# Patient Record
Sex: Female | Born: 1950 | Race: White | Hispanic: No | Smoking: Current every day smoker
Health system: Southern US, Community
[De-identification: ages and names within clinical notes are randomized; demographics above are authoritative.]

## PROBLEM LIST (undated history)

## (undated) DIAGNOSIS — I10 Essential (primary) hypertension: Secondary | ICD-10-CM

---

## 2000-02-12 ENCOUNTER — Encounter: Payer: Self-pay | Admitting: General Surgery

## 2000-02-12 ENCOUNTER — Ambulatory Visit (HOSPITAL_COMMUNITY): Admission: RE | Admit: 2000-02-12 | Discharge: 2000-02-12 | Payer: Self-pay | Admitting: General Surgery

## 2003-06-15 ENCOUNTER — Ambulatory Visit: Admission: RE | Admit: 2003-06-15 | Discharge: 2003-06-15 | Payer: Self-pay | Admitting: Gynecology

## 2003-06-20 ENCOUNTER — Ambulatory Visit (HOSPITAL_COMMUNITY): Admission: RE | Admit: 2003-06-20 | Discharge: 2003-06-20 | Payer: Self-pay | Admitting: Gynecology

## 2003-06-20 ENCOUNTER — Encounter: Payer: Self-pay | Admitting: Gynecology

## 2003-07-20 ENCOUNTER — Ambulatory Visit: Admission: RE | Admit: 2003-07-20 | Discharge: 2003-07-20 | Payer: Self-pay | Admitting: Gynecology

## 2003-07-22 ENCOUNTER — Ambulatory Visit: Admission: RE | Admit: 2003-07-22 | Discharge: 2003-09-16 | Payer: Self-pay | Admitting: Radiation Oncology

## 2003-07-22 ENCOUNTER — Emergency Department (HOSPITAL_COMMUNITY): Admission: EM | Admit: 2003-07-22 | Discharge: 2003-07-22 | Payer: Self-pay | Admitting: Emergency Medicine

## 2003-08-02 ENCOUNTER — Ambulatory Visit: Admission: RE | Admit: 2003-08-02 | Discharge: 2003-08-02 | Payer: Self-pay | Admitting: Gynecology

## 2003-08-03 ENCOUNTER — Ambulatory Visit (HOSPITAL_COMMUNITY): Admission: RE | Admit: 2003-08-03 | Discharge: 2003-08-03 | Payer: Self-pay | Admitting: Radiation Oncology

## 2003-10-11 ENCOUNTER — Other Ambulatory Visit: Admission: RE | Admit: 2003-10-11 | Discharge: 2003-10-11 | Payer: Self-pay | Admitting: Radiation Oncology

## 2003-11-30 ENCOUNTER — Ambulatory Visit: Admission: RE | Admit: 2003-11-30 | Discharge: 2003-11-30 | Payer: Self-pay | Admitting: Gynecology

## 2004-02-07 ENCOUNTER — Ambulatory Visit: Admission: RE | Admit: 2004-02-07 | Discharge: 2004-02-07 | Payer: Self-pay | Admitting: Gynecology

## 2004-02-28 ENCOUNTER — Ambulatory Visit: Admission: RE | Admit: 2004-02-28 | Discharge: 2004-02-28 | Payer: Self-pay | Admitting: Gynecology

## 2004-04-10 ENCOUNTER — Ambulatory Visit: Admission: RE | Admit: 2004-04-10 | Discharge: 2004-04-10 | Payer: Self-pay | Admitting: Radiation Oncology

## 2004-07-11 ENCOUNTER — Other Ambulatory Visit: Admission: RE | Admit: 2004-07-11 | Discharge: 2004-07-11 | Payer: Self-pay | Admitting: Gynecology

## 2004-07-11 ENCOUNTER — Ambulatory Visit: Admission: RE | Admit: 2004-07-11 | Discharge: 2004-07-11 | Payer: Self-pay | Admitting: Gynecology

## 2004-08-16 ENCOUNTER — Ambulatory Visit (HOSPITAL_COMMUNITY): Admission: RE | Admit: 2004-08-16 | Discharge: 2004-08-16 | Payer: Self-pay

## 2004-10-31 ENCOUNTER — Ambulatory Visit: Admission: RE | Admit: 2004-10-31 | Discharge: 2004-10-31 | Payer: Self-pay | Admitting: Gynecology

## 2004-10-31 ENCOUNTER — Other Ambulatory Visit: Admission: RE | Admit: 2004-10-31 | Discharge: 2004-10-31 | Payer: Self-pay | Admitting: Gynecology

## 2005-11-26 ENCOUNTER — Ambulatory Visit (HOSPITAL_COMMUNITY): Admission: RE | Admit: 2005-11-26 | Discharge: 2005-11-26 | Payer: Self-pay | Admitting: Internal Medicine

## 2011-01-20 ENCOUNTER — Encounter: Payer: Self-pay | Admitting: Gynecology

## 2011-01-20 ENCOUNTER — Encounter: Payer: Self-pay | Admitting: Internal Medicine

## 2015-01-31 DIAGNOSIS — M549 Dorsalgia, unspecified: Secondary | ICD-10-CM | POA: Diagnosis not present

## 2015-01-31 DIAGNOSIS — Z79899 Other long term (current) drug therapy: Secondary | ICD-10-CM | POA: Diagnosis not present

## 2015-01-31 DIAGNOSIS — R51 Headache: Secondary | ICD-10-CM | POA: Diagnosis not present

## 2015-01-31 DIAGNOSIS — H5711 Ocular pain, right eye: Secondary | ICD-10-CM | POA: Diagnosis not present

## 2015-01-31 DIAGNOSIS — F419 Anxiety disorder, unspecified: Secondary | ICD-10-CM | POA: Diagnosis not present

## 2015-01-31 DIAGNOSIS — E0590 Thyrotoxicosis, unspecified without thyrotoxic crisis or storm: Secondary | ICD-10-CM | POA: Diagnosis not present

## 2015-01-31 DIAGNOSIS — I10 Essential (primary) hypertension: Secondary | ICD-10-CM | POA: Diagnosis not present

## 2015-01-31 DIAGNOSIS — H5700 Unspecified anomaly of pupillary function: Secondary | ICD-10-CM | POA: Diagnosis not present

## 2015-05-03 DIAGNOSIS — E0590 Thyrotoxicosis, unspecified without thyrotoxic crisis or storm: Secondary | ICD-10-CM | POA: Diagnosis not present

## 2015-05-03 DIAGNOSIS — F419 Anxiety disorder, unspecified: Secondary | ICD-10-CM | POA: Diagnosis not present

## 2015-05-03 DIAGNOSIS — H5711 Ocular pain, right eye: Secondary | ICD-10-CM | POA: Diagnosis not present

## 2015-05-03 DIAGNOSIS — Z79899 Other long term (current) drug therapy: Secondary | ICD-10-CM | POA: Diagnosis not present

## 2015-05-03 DIAGNOSIS — G8929 Other chronic pain: Secondary | ICD-10-CM | POA: Diagnosis not present

## 2015-05-03 DIAGNOSIS — I10 Essential (primary) hypertension: Secondary | ICD-10-CM | POA: Diagnosis not present

## 2015-05-09 DIAGNOSIS — H40211 Acute angle-closure glaucoma, right eye: Secondary | ICD-10-CM | POA: Diagnosis not present

## 2015-05-09 DIAGNOSIS — H5711 Ocular pain, right eye: Secondary | ICD-10-CM | POA: Diagnosis not present

## 2015-05-22 DIAGNOSIS — H40211 Acute angle-closure glaucoma, right eye: Secondary | ICD-10-CM | POA: Diagnosis not present

## 2015-05-30 DIAGNOSIS — H40211 Acute angle-closure glaucoma, right eye: Secondary | ICD-10-CM | POA: Diagnosis not present

## 2015-06-13 DIAGNOSIS — H40211 Acute angle-closure glaucoma, right eye: Secondary | ICD-10-CM | POA: Diagnosis not present

## 2015-07-06 DIAGNOSIS — H40211 Acute angle-closure glaucoma, right eye: Secondary | ICD-10-CM | POA: Diagnosis not present

## 2015-08-03 DIAGNOSIS — H40212 Acute angle-closure glaucoma, left eye: Secondary | ICD-10-CM | POA: Diagnosis not present

## 2015-08-07 DIAGNOSIS — M549 Dorsalgia, unspecified: Secondary | ICD-10-CM | POA: Diagnosis not present

## 2015-08-07 DIAGNOSIS — E0590 Thyrotoxicosis, unspecified without thyrotoxic crisis or storm: Secondary | ICD-10-CM | POA: Diagnosis not present

## 2015-08-07 DIAGNOSIS — F419 Anxiety disorder, unspecified: Secondary | ICD-10-CM | POA: Diagnosis not present

## 2015-08-07 DIAGNOSIS — I10 Essential (primary) hypertension: Secondary | ICD-10-CM | POA: Diagnosis not present

## 2015-09-06 DIAGNOSIS — N390 Urinary tract infection, site not specified: Secondary | ICD-10-CM | POA: Diagnosis not present

## 2015-10-09 DIAGNOSIS — N390 Urinary tract infection, site not specified: Secondary | ICD-10-CM | POA: Diagnosis not present

## 2015-11-08 DIAGNOSIS — Z79899 Other long term (current) drug therapy: Secondary | ICD-10-CM | POA: Diagnosis not present

## 2015-11-08 DIAGNOSIS — E0590 Thyrotoxicosis, unspecified without thyrotoxic crisis or storm: Secondary | ICD-10-CM | POA: Diagnosis not present

## 2015-11-08 DIAGNOSIS — N390 Urinary tract infection, site not specified: Secondary | ICD-10-CM | POA: Diagnosis not present

## 2015-11-08 DIAGNOSIS — R5383 Other fatigue: Secondary | ICD-10-CM | POA: Diagnosis not present

## 2015-11-08 DIAGNOSIS — I10 Essential (primary) hypertension: Secondary | ICD-10-CM | POA: Diagnosis not present

## 2015-11-08 DIAGNOSIS — F419 Anxiety disorder, unspecified: Secondary | ICD-10-CM | POA: Diagnosis not present

## 2015-11-08 DIAGNOSIS — M549 Dorsalgia, unspecified: Secondary | ICD-10-CM | POA: Diagnosis not present

## 2015-11-14 DIAGNOSIS — R140 Abdominal distension (gaseous): Secondary | ICD-10-CM | POA: Diagnosis not present

## 2015-11-14 DIAGNOSIS — R1032 Left lower quadrant pain: Secondary | ICD-10-CM | POA: Diagnosis not present

## 2015-11-15 DIAGNOSIS — R140 Abdominal distension (gaseous): Secondary | ICD-10-CM | POA: Diagnosis not present

## 2015-11-15 DIAGNOSIS — R1909 Other intra-abdominal and pelvic swelling, mass and lump: Secondary | ICD-10-CM | POA: Diagnosis not present

## 2015-11-15 DIAGNOSIS — R1032 Left lower quadrant pain: Secondary | ICD-10-CM | POA: Diagnosis not present

## 2015-11-21 DIAGNOSIS — R109 Unspecified abdominal pain: Secondary | ICD-10-CM | POA: Diagnosis not present

## 2015-11-29 DIAGNOSIS — N2889 Other specified disorders of kidney and ureter: Secondary | ICD-10-CM | POA: Diagnosis not present

## 2015-11-29 DIAGNOSIS — N281 Cyst of kidney, acquired: Secondary | ICD-10-CM | POA: Diagnosis not present

## 2015-11-29 DIAGNOSIS — N289 Disorder of kidney and ureter, unspecified: Secondary | ICD-10-CM | POA: Diagnosis not present

## 2015-12-28 DIAGNOSIS — R102 Pelvic and perineal pain: Secondary | ICD-10-CM | POA: Diagnosis not present

## 2016-01-02 DIAGNOSIS — R102 Pelvic and perineal pain: Secondary | ICD-10-CM | POA: Diagnosis not present

## 2016-02-08 DIAGNOSIS — E0590 Thyrotoxicosis, unspecified without thyrotoxic crisis or storm: Secondary | ICD-10-CM | POA: Diagnosis not present

## 2016-02-08 DIAGNOSIS — M549 Dorsalgia, unspecified: Secondary | ICD-10-CM | POA: Diagnosis not present

## 2016-02-08 DIAGNOSIS — F419 Anxiety disorder, unspecified: Secondary | ICD-10-CM | POA: Diagnosis not present

## 2016-02-08 DIAGNOSIS — I10 Essential (primary) hypertension: Secondary | ICD-10-CM | POA: Diagnosis not present

## 2016-04-25 DIAGNOSIS — R634 Abnormal weight loss: Secondary | ICD-10-CM | POA: Diagnosis not present

## 2016-04-25 DIAGNOSIS — N390 Urinary tract infection, site not specified: Secondary | ICD-10-CM | POA: Diagnosis not present

## 2016-05-10 DIAGNOSIS — H40211 Acute angle-closure glaucoma, right eye: Secondary | ICD-10-CM | POA: Diagnosis not present

## 2016-05-13 DIAGNOSIS — H40211 Acute angle-closure glaucoma, right eye: Secondary | ICD-10-CM | POA: Diagnosis not present

## 2016-05-14 DIAGNOSIS — E0590 Thyrotoxicosis, unspecified without thyrotoxic crisis or storm: Secondary | ICD-10-CM | POA: Diagnosis not present

## 2016-05-14 DIAGNOSIS — D692 Other nonthrombocytopenic purpura: Secondary | ICD-10-CM | POA: Diagnosis not present

## 2016-05-14 DIAGNOSIS — F419 Anxiety disorder, unspecified: Secondary | ICD-10-CM | POA: Diagnosis not present

## 2016-05-14 DIAGNOSIS — R634 Abnormal weight loss: Secondary | ICD-10-CM | POA: Diagnosis not present

## 2016-05-14 DIAGNOSIS — Z79899 Other long term (current) drug therapy: Secondary | ICD-10-CM | POA: Diagnosis not present

## 2016-05-14 DIAGNOSIS — I10 Essential (primary) hypertension: Secondary | ICD-10-CM | POA: Diagnosis not present

## 2016-05-14 DIAGNOSIS — R51 Headache: Secondary | ICD-10-CM | POA: Diagnosis not present

## 2016-05-21 DIAGNOSIS — H40211 Acute angle-closure glaucoma, right eye: Secondary | ICD-10-CM | POA: Diagnosis not present

## 2016-06-04 DIAGNOSIS — H40211 Acute angle-closure glaucoma, right eye: Secondary | ICD-10-CM | POA: Diagnosis not present

## 2016-06-11 DIAGNOSIS — I10 Essential (primary) hypertension: Secondary | ICD-10-CM | POA: Diagnosis not present

## 2016-06-11 DIAGNOSIS — F419 Anxiety disorder, unspecified: Secondary | ICD-10-CM | POA: Diagnosis not present

## 2016-06-11 DIAGNOSIS — E0590 Thyrotoxicosis, unspecified without thyrotoxic crisis or storm: Secondary | ICD-10-CM | POA: Diagnosis not present

## 2016-06-25 DIAGNOSIS — H40032 Anatomical narrow angle, left eye: Secondary | ICD-10-CM | POA: Diagnosis not present

## 2016-06-25 DIAGNOSIS — H40211 Acute angle-closure glaucoma, right eye: Secondary | ICD-10-CM | POA: Diagnosis not present

## 2016-07-09 DIAGNOSIS — H40032 Anatomical narrow angle, left eye: Secondary | ICD-10-CM | POA: Diagnosis not present

## 2016-07-16 DIAGNOSIS — E0590 Thyrotoxicosis, unspecified without thyrotoxic crisis or storm: Secondary | ICD-10-CM | POA: Diagnosis not present

## 2016-07-23 DIAGNOSIS — H40211 Acute angle-closure glaucoma, right eye: Secondary | ICD-10-CM | POA: Diagnosis not present

## 2016-08-13 DIAGNOSIS — F419 Anxiety disorder, unspecified: Secondary | ICD-10-CM | POA: Diagnosis not present

## 2016-08-13 DIAGNOSIS — E0590 Thyrotoxicosis, unspecified without thyrotoxic crisis or storm: Secondary | ICD-10-CM | POA: Diagnosis not present

## 2016-08-13 DIAGNOSIS — I10 Essential (primary) hypertension: Secondary | ICD-10-CM | POA: Diagnosis not present

## 2016-08-26 DIAGNOSIS — H40211 Acute angle-closure glaucoma, right eye: Secondary | ICD-10-CM | POA: Diagnosis not present

## 2016-10-07 DIAGNOSIS — Z1231 Encounter for screening mammogram for malignant neoplasm of breast: Secondary | ICD-10-CM | POA: Diagnosis not present

## 2016-10-23 DIAGNOSIS — N6489 Other specified disorders of breast: Secondary | ICD-10-CM | POA: Diagnosis not present

## 2016-10-23 DIAGNOSIS — R928 Other abnormal and inconclusive findings on diagnostic imaging of breast: Secondary | ICD-10-CM | POA: Diagnosis not present

## 2016-11-11 DIAGNOSIS — Z79899 Other long term (current) drug therapy: Secondary | ICD-10-CM | POA: Diagnosis not present

## 2016-11-11 DIAGNOSIS — E0590 Thyrotoxicosis, unspecified without thyrotoxic crisis or storm: Secondary | ICD-10-CM | POA: Diagnosis not present

## 2016-11-11 DIAGNOSIS — I10 Essential (primary) hypertension: Secondary | ICD-10-CM | POA: Diagnosis not present

## 2016-11-11 DIAGNOSIS — F419 Anxiety disorder, unspecified: Secondary | ICD-10-CM | POA: Diagnosis not present

## 2017-03-17 DIAGNOSIS — Z79899 Other long term (current) drug therapy: Secondary | ICD-10-CM | POA: Diagnosis not present

## 2017-03-17 DIAGNOSIS — M549 Dorsalgia, unspecified: Secondary | ICD-10-CM | POA: Diagnosis not present

## 2017-03-17 DIAGNOSIS — F419 Anxiety disorder, unspecified: Secondary | ICD-10-CM | POA: Diagnosis not present

## 2017-03-17 DIAGNOSIS — E0590 Thyrotoxicosis, unspecified without thyrotoxic crisis or storm: Secondary | ICD-10-CM | POA: Diagnosis not present

## 2017-03-17 DIAGNOSIS — I10 Essential (primary) hypertension: Secondary | ICD-10-CM | POA: Diagnosis not present

## 2017-05-09 DIAGNOSIS — I10 Essential (primary) hypertension: Secondary | ICD-10-CM | POA: Diagnosis not present

## 2017-05-09 DIAGNOSIS — R300 Dysuria: Secondary | ICD-10-CM | POA: Diagnosis not present

## 2017-05-14 DIAGNOSIS — H2513 Age-related nuclear cataract, bilateral: Secondary | ICD-10-CM | POA: Diagnosis not present

## 2017-05-14 DIAGNOSIS — H21541 Posterior synechiae (iris), right eye: Secondary | ICD-10-CM | POA: Diagnosis not present

## 2017-06-16 DIAGNOSIS — M549 Dorsalgia, unspecified: Secondary | ICD-10-CM | POA: Diagnosis not present

## 2017-06-16 DIAGNOSIS — M62838 Other muscle spasm: Secondary | ICD-10-CM | POA: Diagnosis not present

## 2017-07-01 DIAGNOSIS — L720 Epidermal cyst: Secondary | ICD-10-CM | POA: Diagnosis not present

## 2017-07-17 DIAGNOSIS — L720 Epidermal cyst: Secondary | ICD-10-CM | POA: Diagnosis not present

## 2017-07-25 DIAGNOSIS — F419 Anxiety disorder, unspecified: Secondary | ICD-10-CM | POA: Diagnosis not present

## 2017-09-16 DIAGNOSIS — R1030 Lower abdominal pain, unspecified: Secondary | ICD-10-CM | POA: Diagnosis not present

## 2017-09-16 DIAGNOSIS — F419 Anxiety disorder, unspecified: Secondary | ICD-10-CM | POA: Diagnosis not present

## 2017-09-16 DIAGNOSIS — N390 Urinary tract infection, site not specified: Secondary | ICD-10-CM | POA: Diagnosis not present

## 2017-09-16 DIAGNOSIS — Z79899 Other long term (current) drug therapy: Secondary | ICD-10-CM | POA: Diagnosis not present

## 2017-09-16 DIAGNOSIS — E0590 Thyrotoxicosis, unspecified without thyrotoxic crisis or storm: Secondary | ICD-10-CM | POA: Diagnosis not present

## 2017-09-16 DIAGNOSIS — I10 Essential (primary) hypertension: Secondary | ICD-10-CM | POA: Diagnosis not present

## 2017-09-16 DIAGNOSIS — N281 Cyst of kidney, acquired: Secondary | ICD-10-CM | POA: Diagnosis not present

## 2017-09-16 DIAGNOSIS — R109 Unspecified abdominal pain: Secondary | ICD-10-CM | POA: Diagnosis not present

## 2017-09-16 DIAGNOSIS — R197 Diarrhea, unspecified: Secondary | ICD-10-CM | POA: Diagnosis not present

## 2017-09-16 DIAGNOSIS — M549 Dorsalgia, unspecified: Secondary | ICD-10-CM | POA: Diagnosis not present

## 2017-10-07 DIAGNOSIS — E876 Hypokalemia: Secondary | ICD-10-CM | POA: Diagnosis not present

## 2018-01-27 DIAGNOSIS — F5101 Primary insomnia: Secondary | ICD-10-CM | POA: Diagnosis not present

## 2018-01-27 DIAGNOSIS — I10 Essential (primary) hypertension: Secondary | ICD-10-CM | POA: Diagnosis not present

## 2018-01-27 DIAGNOSIS — F419 Anxiety disorder, unspecified: Secondary | ICD-10-CM | POA: Diagnosis not present

## 2018-01-27 DIAGNOSIS — E0590 Thyrotoxicosis, unspecified without thyrotoxic crisis or storm: Secondary | ICD-10-CM | POA: Diagnosis not present

## 2018-01-27 DIAGNOSIS — Z79899 Other long term (current) drug therapy: Secondary | ICD-10-CM | POA: Diagnosis not present

## 2018-01-27 DIAGNOSIS — G894 Chronic pain syndrome: Secondary | ICD-10-CM | POA: Diagnosis not present

## 2018-05-13 DIAGNOSIS — Z79899 Other long term (current) drug therapy: Secondary | ICD-10-CM | POA: Diagnosis not present

## 2018-05-13 DIAGNOSIS — F419 Anxiety disorder, unspecified: Secondary | ICD-10-CM | POA: Diagnosis not present

## 2018-05-13 DIAGNOSIS — E0590 Thyrotoxicosis, unspecified without thyrotoxic crisis or storm: Secondary | ICD-10-CM | POA: Diagnosis not present

## 2018-05-18 DIAGNOSIS — I10 Essential (primary) hypertension: Secondary | ICD-10-CM | POA: Diagnosis not present

## 2018-05-18 DIAGNOSIS — E0590 Thyrotoxicosis, unspecified without thyrotoxic crisis or storm: Secondary | ICD-10-CM | POA: Diagnosis not present

## 2018-05-18 DIAGNOSIS — L989 Disorder of the skin and subcutaneous tissue, unspecified: Secondary | ICD-10-CM | POA: Diagnosis not present

## 2018-05-18 DIAGNOSIS — R630 Anorexia: Secondary | ICD-10-CM | POA: Diagnosis not present

## 2018-05-18 DIAGNOSIS — R21 Rash and other nonspecific skin eruption: Secondary | ICD-10-CM | POA: Diagnosis not present

## 2018-06-15 DIAGNOSIS — E0590 Thyrotoxicosis, unspecified without thyrotoxic crisis or storm: Secondary | ICD-10-CM | POA: Diagnosis not present

## 2018-06-15 DIAGNOSIS — R002 Palpitations: Secondary | ICD-10-CM | POA: Diagnosis not present

## 2018-06-15 DIAGNOSIS — F419 Anxiety disorder, unspecified: Secondary | ICD-10-CM | POA: Diagnosis not present

## 2018-06-15 DIAGNOSIS — I10 Essential (primary) hypertension: Secondary | ICD-10-CM | POA: Diagnosis not present

## 2018-07-27 DIAGNOSIS — R5383 Other fatigue: Secondary | ICD-10-CM | POA: Diagnosis not present

## 2018-07-27 DIAGNOSIS — R634 Abnormal weight loss: Secondary | ICD-10-CM | POA: Diagnosis not present

## 2018-07-27 DIAGNOSIS — I10 Essential (primary) hypertension: Secondary | ICD-10-CM | POA: Diagnosis not present

## 2018-07-27 DIAGNOSIS — R630 Anorexia: Secondary | ICD-10-CM | POA: Diagnosis not present

## 2018-07-27 DIAGNOSIS — E0590 Thyrotoxicosis, unspecified without thyrotoxic crisis or storm: Secondary | ICD-10-CM | POA: Diagnosis not present

## 2018-07-27 DIAGNOSIS — Z79899 Other long term (current) drug therapy: Secondary | ICD-10-CM | POA: Diagnosis not present

## 2018-08-03 DIAGNOSIS — N6320 Unspecified lump in the left breast, unspecified quadrant: Secondary | ICD-10-CM | POA: Diagnosis not present

## 2018-08-03 DIAGNOSIS — D239 Other benign neoplasm of skin, unspecified: Secondary | ICD-10-CM | POA: Diagnosis not present

## 2018-08-03 DIAGNOSIS — Z124 Encounter for screening for malignant neoplasm of cervix: Secondary | ICD-10-CM | POA: Diagnosis not present

## 2018-08-03 DIAGNOSIS — Z1159 Encounter for screening for other viral diseases: Secondary | ICD-10-CM | POA: Diagnosis not present

## 2018-08-03 DIAGNOSIS — Z1331 Encounter for screening for depression: Secondary | ICD-10-CM | POA: Diagnosis not present

## 2018-08-03 DIAGNOSIS — M310 Hypersensitivity angiitis: Secondary | ICD-10-CM | POA: Diagnosis not present

## 2018-08-03 DIAGNOSIS — K6289 Other specified diseases of anus and rectum: Secondary | ICD-10-CM | POA: Diagnosis not present

## 2018-08-03 DIAGNOSIS — Z0001 Encounter for general adult medical examination with abnormal findings: Secondary | ICD-10-CM | POA: Diagnosis not present

## 2018-08-03 DIAGNOSIS — Z1389 Encounter for screening for other disorder: Secondary | ICD-10-CM | POA: Diagnosis not present

## 2018-08-03 DIAGNOSIS — R634 Abnormal weight loss: Secondary | ICD-10-CM | POA: Diagnosis not present

## 2018-08-06 DIAGNOSIS — I10 Essential (primary) hypertension: Secondary | ICD-10-CM | POA: Diagnosis not present

## 2018-08-06 DIAGNOSIS — M310 Hypersensitivity angiitis: Secondary | ICD-10-CM | POA: Diagnosis not present

## 2018-08-06 DIAGNOSIS — R768 Other specified abnormal immunological findings in serum: Secondary | ICD-10-CM | POA: Diagnosis not present

## 2018-08-06 DIAGNOSIS — R634 Abnormal weight loss: Secondary | ICD-10-CM | POA: Diagnosis not present

## 2018-08-06 DIAGNOSIS — B1920 Unspecified viral hepatitis C without hepatic coma: Secondary | ICD-10-CM | POA: Diagnosis not present

## 2018-08-18 DIAGNOSIS — B1920 Unspecified viral hepatitis C without hepatic coma: Secondary | ICD-10-CM | POA: Diagnosis not present

## 2018-08-18 DIAGNOSIS — I10 Essential (primary) hypertension: Secondary | ICD-10-CM | POA: Diagnosis not present

## 2018-08-27 DIAGNOSIS — K629 Disease of anus and rectum, unspecified: Secondary | ICD-10-CM | POA: Diagnosis not present

## 2018-08-27 DIAGNOSIS — N9489 Other specified conditions associated with female genital organs and menstrual cycle: Secondary | ICD-10-CM | POA: Diagnosis not present

## 2018-08-27 DIAGNOSIS — Z1211 Encounter for screening for malignant neoplasm of colon: Secondary | ICD-10-CM | POA: Diagnosis not present

## 2018-08-27 DIAGNOSIS — B182 Chronic viral hepatitis C: Secondary | ICD-10-CM | POA: Diagnosis not present

## 2018-08-27 DIAGNOSIS — R1032 Left lower quadrant pain: Secondary | ICD-10-CM | POA: Diagnosis not present

## 2018-09-01 DIAGNOSIS — N281 Cyst of kidney, acquired: Secondary | ICD-10-CM | POA: Diagnosis not present

## 2018-09-01 DIAGNOSIS — R5383 Other fatigue: Secondary | ICD-10-CM | POA: Diagnosis not present

## 2018-09-01 DIAGNOSIS — I700 Atherosclerosis of aorta: Secondary | ICD-10-CM | POA: Diagnosis not present

## 2018-09-01 DIAGNOSIS — B1920 Unspecified viral hepatitis C without hepatic coma: Secondary | ICD-10-CM | POA: Diagnosis not present

## 2018-09-01 DIAGNOSIS — Z9049 Acquired absence of other specified parts of digestive tract: Secondary | ICD-10-CM | POA: Diagnosis not present

## 2018-09-01 DIAGNOSIS — F419 Anxiety disorder, unspecified: Secondary | ICD-10-CM | POA: Diagnosis not present

## 2018-10-27 DIAGNOSIS — B1920 Unspecified viral hepatitis C without hepatic coma: Secondary | ICD-10-CM | POA: Diagnosis not present

## 2018-10-27 DIAGNOSIS — I10 Essential (primary) hypertension: Secondary | ICD-10-CM | POA: Diagnosis not present

## 2018-10-27 DIAGNOSIS — F419 Anxiety disorder, unspecified: Secondary | ICD-10-CM | POA: Diagnosis not present

## 2018-12-28 DIAGNOSIS — I10 Essential (primary) hypertension: Secondary | ICD-10-CM | POA: Diagnosis not present

## 2018-12-28 DIAGNOSIS — B1920 Unspecified viral hepatitis C without hepatic coma: Secondary | ICD-10-CM | POA: Diagnosis not present

## 2018-12-28 DIAGNOSIS — Z79899 Other long term (current) drug therapy: Secondary | ICD-10-CM | POA: Diagnosis not present

## 2018-12-28 DIAGNOSIS — F419 Anxiety disorder, unspecified: Secondary | ICD-10-CM | POA: Diagnosis not present

## 2018-12-28 DIAGNOSIS — E0590 Thyrotoxicosis, unspecified without thyrotoxic crisis or storm: Secondary | ICD-10-CM | POA: Diagnosis not present

## 2019-03-30 DIAGNOSIS — E0590 Thyrotoxicosis, unspecified without thyrotoxic crisis or storm: Secondary | ICD-10-CM | POA: Diagnosis not present

## 2019-03-30 DIAGNOSIS — F419 Anxiety disorder, unspecified: Secondary | ICD-10-CM | POA: Diagnosis not present

## 2019-03-30 DIAGNOSIS — J302 Other seasonal allergic rhinitis: Secondary | ICD-10-CM | POA: Diagnosis not present

## 2019-03-30 DIAGNOSIS — I10 Essential (primary) hypertension: Secondary | ICD-10-CM | POA: Diagnosis not present

## 2019-07-27 DIAGNOSIS — B1920 Unspecified viral hepatitis C without hepatic coma: Secondary | ICD-10-CM | POA: Diagnosis not present

## 2019-07-27 DIAGNOSIS — I10 Essential (primary) hypertension: Secondary | ICD-10-CM | POA: Diagnosis not present

## 2019-07-27 DIAGNOSIS — Z79899 Other long term (current) drug therapy: Secondary | ICD-10-CM | POA: Diagnosis not present

## 2019-07-27 DIAGNOSIS — E0590 Thyrotoxicosis, unspecified without thyrotoxic crisis or storm: Secondary | ICD-10-CM | POA: Diagnosis not present

## 2019-07-27 DIAGNOSIS — F419 Anxiety disorder, unspecified: Secondary | ICD-10-CM | POA: Diagnosis not present

## 2019-11-03 DIAGNOSIS — Z6820 Body mass index (BMI) 20.0-20.9, adult: Secondary | ICD-10-CM | POA: Diagnosis not present

## 2019-11-03 DIAGNOSIS — M25512 Pain in left shoulder: Secondary | ICD-10-CM | POA: Diagnosis not present

## 2019-11-03 DIAGNOSIS — M19012 Primary osteoarthritis, left shoulder: Secondary | ICD-10-CM | POA: Diagnosis not present

## 2019-11-03 DIAGNOSIS — M5032 Other cervical disc degeneration, mid-cervical region, unspecified level: Secondary | ICD-10-CM | POA: Diagnosis not present

## 2019-11-03 DIAGNOSIS — M542 Cervicalgia: Secondary | ICD-10-CM | POA: Diagnosis not present

## 2019-12-27 DIAGNOSIS — E0590 Thyrotoxicosis, unspecified without thyrotoxic crisis or storm: Secondary | ICD-10-CM | POA: Diagnosis not present

## 2019-12-27 DIAGNOSIS — Z6821 Body mass index (BMI) 21.0-21.9, adult: Secondary | ICD-10-CM | POA: Diagnosis not present

## 2019-12-27 DIAGNOSIS — I10 Essential (primary) hypertension: Secondary | ICD-10-CM | POA: Diagnosis not present

## 2019-12-27 DIAGNOSIS — Z79899 Other long term (current) drug therapy: Secondary | ICD-10-CM | POA: Diagnosis not present

## 2019-12-27 DIAGNOSIS — F419 Anxiety disorder, unspecified: Secondary | ICD-10-CM | POA: Diagnosis not present

## 2019-12-27 DIAGNOSIS — Z1331 Encounter for screening for depression: Secondary | ICD-10-CM | POA: Diagnosis not present

## 2019-12-27 DIAGNOSIS — Z23 Encounter for immunization: Secondary | ICD-10-CM | POA: Diagnosis not present

## 2019-12-27 DIAGNOSIS — Z1339 Encounter for screening examination for other mental health and behavioral disorders: Secondary | ICD-10-CM | POA: Diagnosis not present

## 2019-12-27 DIAGNOSIS — Z0000 Encounter for general adult medical examination without abnormal findings: Secondary | ICD-10-CM | POA: Diagnosis not present

## 2020-04-05 DIAGNOSIS — M25561 Pain in right knee: Secondary | ICD-10-CM | POA: Diagnosis not present

## 2020-04-05 DIAGNOSIS — M25551 Pain in right hip: Secondary | ICD-10-CM | POA: Diagnosis not present

## 2020-04-05 DIAGNOSIS — E0590 Thyrotoxicosis, unspecified without thyrotoxic crisis or storm: Secondary | ICD-10-CM | POA: Diagnosis not present

## 2020-04-05 DIAGNOSIS — Z1331 Encounter for screening for depression: Secondary | ICD-10-CM | POA: Diagnosis not present

## 2020-04-05 DIAGNOSIS — Z6822 Body mass index (BMI) 22.0-22.9, adult: Secondary | ICD-10-CM | POA: Diagnosis not present

## 2020-04-05 DIAGNOSIS — Z79899 Other long term (current) drug therapy: Secondary | ICD-10-CM | POA: Diagnosis not present

## 2020-04-06 DIAGNOSIS — M25561 Pain in right knee: Secondary | ICD-10-CM | POA: Diagnosis not present

## 2020-04-06 DIAGNOSIS — M25551 Pain in right hip: Secondary | ICD-10-CM | POA: Diagnosis not present

## 2020-04-06 DIAGNOSIS — M1611 Unilateral primary osteoarthritis, right hip: Secondary | ICD-10-CM | POA: Diagnosis not present

## 2020-04-06 DIAGNOSIS — M1711 Unilateral primary osteoarthritis, right knee: Secondary | ICD-10-CM | POA: Diagnosis not present

## 2020-06-27 DIAGNOSIS — Z1331 Encounter for screening for depression: Secondary | ICD-10-CM | POA: Diagnosis not present

## 2020-06-27 DIAGNOSIS — R1909 Other intra-abdominal and pelvic swelling, mass and lump: Secondary | ICD-10-CM | POA: Diagnosis not present

## 2020-06-27 DIAGNOSIS — M25561 Pain in right knee: Secondary | ICD-10-CM | POA: Diagnosis not present

## 2020-07-14 DIAGNOSIS — R1909 Other intra-abdominal and pelvic swelling, mass and lump: Secondary | ICD-10-CM | POA: Diagnosis not present

## 2020-07-14 DIAGNOSIS — M25561 Pain in right knee: Secondary | ICD-10-CM | POA: Diagnosis not present

## 2020-08-03 DIAGNOSIS — M1711 Unilateral primary osteoarthritis, right knee: Secondary | ICD-10-CM | POA: Diagnosis not present

## 2020-08-28 DIAGNOSIS — Z79899 Other long term (current) drug therapy: Secondary | ICD-10-CM | POA: Diagnosis not present

## 2020-08-28 DIAGNOSIS — Z01818 Encounter for other preprocedural examination: Secondary | ICD-10-CM | POA: Diagnosis not present

## 2020-08-28 DIAGNOSIS — E559 Vitamin D deficiency, unspecified: Secondary | ICD-10-CM | POA: Diagnosis not present

## 2020-08-28 DIAGNOSIS — M79609 Pain in unspecified limb: Secondary | ICD-10-CM | POA: Diagnosis not present

## 2020-08-28 DIAGNOSIS — R52 Pain, unspecified: Secondary | ICD-10-CM | POA: Diagnosis not present

## 2020-09-13 DIAGNOSIS — F419 Anxiety disorder, unspecified: Secondary | ICD-10-CM | POA: Diagnosis not present

## 2020-09-13 DIAGNOSIS — I10 Essential (primary) hypertension: Secondary | ICD-10-CM | POA: Diagnosis not present

## 2020-10-24 DIAGNOSIS — Z1159 Encounter for screening for other viral diseases: Secondary | ICD-10-CM | POA: Diagnosis not present

## 2020-10-24 DIAGNOSIS — Z1152 Encounter for screening for COVID-19: Secondary | ICD-10-CM | POA: Diagnosis not present

## 2020-10-25 DIAGNOSIS — M79609 Pain in unspecified limb: Secondary | ICD-10-CM | POA: Diagnosis not present

## 2020-10-25 DIAGNOSIS — Z79899 Other long term (current) drug therapy: Secondary | ICD-10-CM | POA: Diagnosis not present

## 2020-10-25 DIAGNOSIS — Z01818 Encounter for other preprocedural examination: Secondary | ICD-10-CM | POA: Diagnosis not present

## 2020-10-25 DIAGNOSIS — R52 Pain, unspecified: Secondary | ICD-10-CM | POA: Diagnosis not present

## 2020-10-30 DIAGNOSIS — Z9889 Other specified postprocedural states: Secondary | ICD-10-CM | POA: Diagnosis not present

## 2020-10-30 DIAGNOSIS — Z96651 Presence of right artificial knee joint: Secondary | ICD-10-CM | POA: Diagnosis not present

## 2020-10-30 DIAGNOSIS — M1711 Unilateral primary osteoarthritis, right knee: Secondary | ICD-10-CM | POA: Diagnosis not present

## 2020-10-30 DIAGNOSIS — Z8614 Personal history of Methicillin resistant Staphylococcus aureus infection: Secondary | ICD-10-CM | POA: Diagnosis not present

## 2020-10-30 DIAGNOSIS — B182 Chronic viral hepatitis C: Secondary | ICD-10-CM | POA: Diagnosis not present

## 2020-10-30 DIAGNOSIS — F17200 Nicotine dependence, unspecified, uncomplicated: Secondary | ICD-10-CM | POA: Diagnosis not present

## 2020-10-30 DIAGNOSIS — Z79899 Other long term (current) drug therapy: Secondary | ICD-10-CM | POA: Diagnosis not present

## 2020-10-30 DIAGNOSIS — I10 Essential (primary) hypertension: Secondary | ICD-10-CM | POA: Diagnosis not present

## 2020-10-30 DIAGNOSIS — F17210 Nicotine dependence, cigarettes, uncomplicated: Secondary | ICD-10-CM | POA: Diagnosis not present

## 2020-10-30 DIAGNOSIS — Z471 Aftercare following joint replacement surgery: Secondary | ICD-10-CM | POA: Diagnosis not present

## 2020-10-30 DIAGNOSIS — G8918 Other acute postprocedural pain: Secondary | ICD-10-CM | POA: Diagnosis not present

## 2020-10-30 DIAGNOSIS — E0500 Thyrotoxicosis with diffuse goiter without thyrotoxic crisis or storm: Secondary | ICD-10-CM | POA: Diagnosis not present

## 2020-10-31 DIAGNOSIS — I10 Essential (primary) hypertension: Secondary | ICD-10-CM | POA: Diagnosis not present

## 2020-10-31 DIAGNOSIS — B182 Chronic viral hepatitis C: Secondary | ICD-10-CM | POA: Diagnosis not present

## 2020-10-31 DIAGNOSIS — M1711 Unilateral primary osteoarthritis, right knee: Secondary | ICD-10-CM | POA: Diagnosis not present

## 2020-10-31 DIAGNOSIS — E0500 Thyrotoxicosis with diffuse goiter without thyrotoxic crisis or storm: Secondary | ICD-10-CM | POA: Diagnosis not present

## 2020-10-31 DIAGNOSIS — F17200 Nicotine dependence, unspecified, uncomplicated: Secondary | ICD-10-CM | POA: Diagnosis not present

## 2020-10-31 DIAGNOSIS — Z8614 Personal history of Methicillin resistant Staphylococcus aureus infection: Secondary | ICD-10-CM | POA: Diagnosis not present

## 2020-10-31 DIAGNOSIS — Z79899 Other long term (current) drug therapy: Secondary | ICD-10-CM | POA: Diagnosis not present

## 2020-11-01 DIAGNOSIS — N9489 Other specified conditions associated with female genital organs and menstrual cycle: Secondary | ICD-10-CM | POA: Diagnosis not present

## 2020-11-01 DIAGNOSIS — B182 Chronic viral hepatitis C: Secondary | ICD-10-CM | POA: Diagnosis not present

## 2020-11-01 DIAGNOSIS — M5136 Other intervertebral disc degeneration, lumbar region: Secondary | ICD-10-CM | POA: Diagnosis not present

## 2020-11-01 DIAGNOSIS — I10 Essential (primary) hypertension: Secondary | ICD-10-CM | POA: Diagnosis not present

## 2020-11-01 DIAGNOSIS — F17290 Nicotine dependence, other tobacco product, uncomplicated: Secondary | ICD-10-CM | POA: Diagnosis not present

## 2020-11-01 DIAGNOSIS — F419 Anxiety disorder, unspecified: Secondary | ICD-10-CM | POA: Diagnosis not present

## 2020-11-01 DIAGNOSIS — Z9181 History of falling: Secondary | ICD-10-CM | POA: Diagnosis not present

## 2020-11-01 DIAGNOSIS — Z471 Aftercare following joint replacement surgery: Secondary | ICD-10-CM | POA: Diagnosis not present

## 2020-11-01 DIAGNOSIS — E0500 Thyrotoxicosis with diffuse goiter without thyrotoxic crisis or storm: Secondary | ICD-10-CM | POA: Diagnosis not present

## 2020-11-03 DIAGNOSIS — Z9181 History of falling: Secondary | ICD-10-CM | POA: Diagnosis not present

## 2020-11-03 DIAGNOSIS — B182 Chronic viral hepatitis C: Secondary | ICD-10-CM | POA: Diagnosis not present

## 2020-11-03 DIAGNOSIS — Z471 Aftercare following joint replacement surgery: Secondary | ICD-10-CM | POA: Diagnosis not present

## 2020-11-03 DIAGNOSIS — E0500 Thyrotoxicosis with diffuse goiter without thyrotoxic crisis or storm: Secondary | ICD-10-CM | POA: Diagnosis not present

## 2020-11-03 DIAGNOSIS — M5136 Other intervertebral disc degeneration, lumbar region: Secondary | ICD-10-CM | POA: Diagnosis not present

## 2020-11-03 DIAGNOSIS — F17290 Nicotine dependence, other tobacco product, uncomplicated: Secondary | ICD-10-CM | POA: Diagnosis not present

## 2020-11-03 DIAGNOSIS — N9489 Other specified conditions associated with female genital organs and menstrual cycle: Secondary | ICD-10-CM | POA: Diagnosis not present

## 2020-11-03 DIAGNOSIS — F419 Anxiety disorder, unspecified: Secondary | ICD-10-CM | POA: Diagnosis not present

## 2020-11-03 DIAGNOSIS — I10 Essential (primary) hypertension: Secondary | ICD-10-CM | POA: Diagnosis not present

## 2020-11-04 DIAGNOSIS — N9489 Other specified conditions associated with female genital organs and menstrual cycle: Secondary | ICD-10-CM | POA: Diagnosis not present

## 2020-11-04 DIAGNOSIS — I10 Essential (primary) hypertension: Secondary | ICD-10-CM | POA: Diagnosis not present

## 2020-11-04 DIAGNOSIS — M5136 Other intervertebral disc degeneration, lumbar region: Secondary | ICD-10-CM | POA: Diagnosis not present

## 2020-11-04 DIAGNOSIS — F17290 Nicotine dependence, other tobacco product, uncomplicated: Secondary | ICD-10-CM | POA: Diagnosis not present

## 2020-11-04 DIAGNOSIS — B182 Chronic viral hepatitis C: Secondary | ICD-10-CM | POA: Diagnosis not present

## 2020-11-04 DIAGNOSIS — Z9181 History of falling: Secondary | ICD-10-CM | POA: Diagnosis not present

## 2020-11-04 DIAGNOSIS — E0500 Thyrotoxicosis with diffuse goiter without thyrotoxic crisis or storm: Secondary | ICD-10-CM | POA: Diagnosis not present

## 2020-11-04 DIAGNOSIS — F419 Anxiety disorder, unspecified: Secondary | ICD-10-CM | POA: Diagnosis not present

## 2020-11-04 DIAGNOSIS — Z471 Aftercare following joint replacement surgery: Secondary | ICD-10-CM | POA: Diagnosis not present

## 2020-11-08 DIAGNOSIS — N9489 Other specified conditions associated with female genital organs and menstrual cycle: Secondary | ICD-10-CM | POA: Diagnosis not present

## 2020-11-08 DIAGNOSIS — F17290 Nicotine dependence, other tobacco product, uncomplicated: Secondary | ICD-10-CM | POA: Diagnosis not present

## 2020-11-08 DIAGNOSIS — F419 Anxiety disorder, unspecified: Secondary | ICD-10-CM | POA: Diagnosis not present

## 2020-11-08 DIAGNOSIS — Z471 Aftercare following joint replacement surgery: Secondary | ICD-10-CM | POA: Diagnosis not present

## 2020-11-08 DIAGNOSIS — B182 Chronic viral hepatitis C: Secondary | ICD-10-CM | POA: Diagnosis not present

## 2020-11-08 DIAGNOSIS — M5136 Other intervertebral disc degeneration, lumbar region: Secondary | ICD-10-CM | POA: Diagnosis not present

## 2020-11-08 DIAGNOSIS — I10 Essential (primary) hypertension: Secondary | ICD-10-CM | POA: Diagnosis not present

## 2020-11-08 DIAGNOSIS — Z9181 History of falling: Secondary | ICD-10-CM | POA: Diagnosis not present

## 2020-11-08 DIAGNOSIS — E0500 Thyrotoxicosis with diffuse goiter without thyrotoxic crisis or storm: Secondary | ICD-10-CM | POA: Diagnosis not present

## 2020-11-09 DIAGNOSIS — E0500 Thyrotoxicosis with diffuse goiter without thyrotoxic crisis or storm: Secondary | ICD-10-CM | POA: Diagnosis not present

## 2020-11-09 DIAGNOSIS — F17290 Nicotine dependence, other tobacco product, uncomplicated: Secondary | ICD-10-CM | POA: Diagnosis not present

## 2020-11-09 DIAGNOSIS — Z9181 History of falling: Secondary | ICD-10-CM | POA: Diagnosis not present

## 2020-11-09 DIAGNOSIS — F419 Anxiety disorder, unspecified: Secondary | ICD-10-CM | POA: Diagnosis not present

## 2020-11-09 DIAGNOSIS — Z471 Aftercare following joint replacement surgery: Secondary | ICD-10-CM | POA: Diagnosis not present

## 2020-11-09 DIAGNOSIS — M5136 Other intervertebral disc degeneration, lumbar region: Secondary | ICD-10-CM | POA: Diagnosis not present

## 2020-11-09 DIAGNOSIS — I10 Essential (primary) hypertension: Secondary | ICD-10-CM | POA: Diagnosis not present

## 2020-11-09 DIAGNOSIS — B182 Chronic viral hepatitis C: Secondary | ICD-10-CM | POA: Diagnosis not present

## 2020-11-09 DIAGNOSIS — N9489 Other specified conditions associated with female genital organs and menstrual cycle: Secondary | ICD-10-CM | POA: Diagnosis not present

## 2021-02-01 DIAGNOSIS — F419 Anxiety disorder, unspecified: Secondary | ICD-10-CM | POA: Diagnosis not present

## 2021-02-01 DIAGNOSIS — E0590 Thyrotoxicosis, unspecified without thyrotoxic crisis or storm: Secondary | ICD-10-CM | POA: Diagnosis not present

## 2021-02-01 DIAGNOSIS — I10 Essential (primary) hypertension: Secondary | ICD-10-CM | POA: Diagnosis not present

## 2021-02-01 DIAGNOSIS — Z79899 Other long term (current) drug therapy: Secondary | ICD-10-CM | POA: Diagnosis not present

## 2021-05-15 DIAGNOSIS — E0590 Thyrotoxicosis, unspecified without thyrotoxic crisis or storm: Secondary | ICD-10-CM | POA: Diagnosis not present

## 2021-07-17 DIAGNOSIS — H21541 Posterior synechiae (iris), right eye: Secondary | ICD-10-CM | POA: Diagnosis not present

## 2021-07-17 DIAGNOSIS — H40211 Acute angle-closure glaucoma, right eye: Secondary | ICD-10-CM | POA: Diagnosis not present

## 2021-07-17 DIAGNOSIS — H40032 Anatomical narrow angle, left eye: Secondary | ICD-10-CM | POA: Diagnosis not present

## 2021-07-17 DIAGNOSIS — H2513 Age-related nuclear cataract, bilateral: Secondary | ICD-10-CM | POA: Diagnosis not present

## 2021-07-20 DIAGNOSIS — H401113 Primary open-angle glaucoma, right eye, severe stage: Secondary | ICD-10-CM | POA: Diagnosis not present

## 2021-07-20 DIAGNOSIS — Z01818 Encounter for other preprocedural examination: Secondary | ICD-10-CM | POA: Diagnosis not present

## 2021-07-20 DIAGNOSIS — H2511 Age-related nuclear cataract, right eye: Secondary | ICD-10-CM | POA: Diagnosis not present

## 2021-08-01 DIAGNOSIS — I10 Essential (primary) hypertension: Secondary | ICD-10-CM | POA: Diagnosis not present

## 2021-08-01 DIAGNOSIS — Z681 Body mass index (BMI) 19 or less, adult: Secondary | ICD-10-CM | POA: Diagnosis not present

## 2021-08-01 DIAGNOSIS — E0590 Thyrotoxicosis, unspecified without thyrotoxic crisis or storm: Secondary | ICD-10-CM | POA: Diagnosis not present

## 2021-08-01 DIAGNOSIS — Z79899 Other long term (current) drug therapy: Secondary | ICD-10-CM | POA: Diagnosis not present

## 2021-08-01 DIAGNOSIS — F419 Anxiety disorder, unspecified: Secondary | ICD-10-CM | POA: Diagnosis not present

## 2021-08-20 DIAGNOSIS — H409 Unspecified glaucoma: Secondary | ICD-10-CM | POA: Diagnosis not present

## 2021-08-20 DIAGNOSIS — H2511 Age-related nuclear cataract, right eye: Secondary | ICD-10-CM | POA: Diagnosis not present

## 2021-08-20 DIAGNOSIS — H401113 Primary open-angle glaucoma, right eye, severe stage: Secondary | ICD-10-CM | POA: Diagnosis not present

## 2021-10-01 DIAGNOSIS — H409 Unspecified glaucoma: Secondary | ICD-10-CM | POA: Diagnosis not present

## 2021-10-01 DIAGNOSIS — H40032 Anatomical narrow angle, left eye: Secondary | ICD-10-CM | POA: Diagnosis not present

## 2021-10-01 DIAGNOSIS — H2512 Age-related nuclear cataract, left eye: Secondary | ICD-10-CM | POA: Diagnosis not present

## 2021-11-05 DIAGNOSIS — H409 Unspecified glaucoma: Secondary | ICD-10-CM | POA: Diagnosis not present

## 2021-11-05 DIAGNOSIS — I10 Essential (primary) hypertension: Secondary | ICD-10-CM | POA: Diagnosis not present

## 2021-11-05 DIAGNOSIS — Z1331 Encounter for screening for depression: Secondary | ICD-10-CM | POA: Diagnosis not present

## 2021-11-05 DIAGNOSIS — Z1231 Encounter for screening mammogram for malignant neoplasm of breast: Secondary | ICD-10-CM | POA: Diagnosis not present

## 2021-11-05 DIAGNOSIS — E0590 Thyrotoxicosis, unspecified without thyrotoxic crisis or storm: Secondary | ICD-10-CM | POA: Diagnosis not present

## 2021-11-05 DIAGNOSIS — Z1211 Encounter for screening for malignant neoplasm of colon: Secondary | ICD-10-CM | POA: Diagnosis not present

## 2021-11-05 DIAGNOSIS — Z0000 Encounter for general adult medical examination without abnormal findings: Secondary | ICD-10-CM | POA: Diagnosis not present

## 2021-11-05 DIAGNOSIS — R5383 Other fatigue: Secondary | ICD-10-CM | POA: Diagnosis not present

## 2021-11-05 DIAGNOSIS — Z79899 Other long term (current) drug therapy: Secondary | ICD-10-CM | POA: Diagnosis not present

## 2021-11-06 DIAGNOSIS — Z1211 Encounter for screening for malignant neoplasm of colon: Secondary | ICD-10-CM | POA: Diagnosis not present

## 2021-12-12 DIAGNOSIS — H524 Presbyopia: Secondary | ICD-10-CM | POA: Diagnosis not present

## 2021-12-12 DIAGNOSIS — H52223 Regular astigmatism, bilateral: Secondary | ICD-10-CM | POA: Diagnosis not present

## 2022-01-08 DIAGNOSIS — R197 Diarrhea, unspecified: Secondary | ICD-10-CM | POA: Diagnosis not present

## 2022-01-08 DIAGNOSIS — R109 Unspecified abdominal pain: Secondary | ICD-10-CM | POA: Diagnosis not present

## 2022-01-08 DIAGNOSIS — Z8619 Personal history of other infectious and parasitic diseases: Secondary | ICD-10-CM | POA: Diagnosis not present

## 2022-01-08 DIAGNOSIS — I10 Essential (primary) hypertension: Secondary | ICD-10-CM | POA: Diagnosis not present

## 2022-01-08 DIAGNOSIS — E0590 Thyrotoxicosis, unspecified without thyrotoxic crisis or storm: Secondary | ICD-10-CM | POA: Diagnosis not present

## 2022-01-08 DIAGNOSIS — R634 Abnormal weight loss: Secondary | ICD-10-CM | POA: Diagnosis not present

## 2022-01-08 DIAGNOSIS — Z681 Body mass index (BMI) 19 or less, adult: Secondary | ICD-10-CM | POA: Diagnosis not present

## 2022-01-08 DIAGNOSIS — E46 Unspecified protein-calorie malnutrition: Secondary | ICD-10-CM | POA: Diagnosis not present

## 2022-02-06 DIAGNOSIS — H402213 Chronic angle-closure glaucoma, right eye, severe stage: Secondary | ICD-10-CM | POA: Diagnosis not present

## 2022-02-06 DIAGNOSIS — H40002 Preglaucoma, unspecified, left eye: Secondary | ICD-10-CM | POA: Diagnosis not present

## 2022-02-15 DIAGNOSIS — R197 Diarrhea, unspecified: Secondary | ICD-10-CM | POA: Diagnosis not present

## 2022-02-15 DIAGNOSIS — E0590 Thyrotoxicosis, unspecified without thyrotoxic crisis or storm: Secondary | ICD-10-CM | POA: Diagnosis not present

## 2022-02-15 DIAGNOSIS — I10 Essential (primary) hypertension: Secondary | ICD-10-CM | POA: Diagnosis not present

## 2022-02-15 DIAGNOSIS — Z681 Body mass index (BMI) 19 or less, adult: Secondary | ICD-10-CM | POA: Diagnosis not present

## 2022-02-15 DIAGNOSIS — F419 Anxiety disorder, unspecified: Secondary | ICD-10-CM | POA: Diagnosis not present

## 2022-03-04 DIAGNOSIS — M6282 Rhabdomyolysis: Secondary | ICD-10-CM | POA: Diagnosis not present

## 2022-03-04 DIAGNOSIS — Z7982 Long term (current) use of aspirin: Secondary | ICD-10-CM | POA: Diagnosis not present

## 2022-03-04 DIAGNOSIS — I10 Essential (primary) hypertension: Secondary | ICD-10-CM | POA: Diagnosis not present

## 2022-03-04 DIAGNOSIS — N179 Acute kidney failure, unspecified: Secondary | ICD-10-CM | POA: Diagnosis not present

## 2022-03-04 DIAGNOSIS — Z79899 Other long term (current) drug therapy: Secondary | ICD-10-CM | POA: Diagnosis not present

## 2022-03-04 DIAGNOSIS — R4182 Altered mental status, unspecified: Secondary | ICD-10-CM | POA: Diagnosis not present

## 2022-03-04 DIAGNOSIS — G9341 Metabolic encephalopathy: Secondary | ICD-10-CM | POA: Diagnosis not present

## 2022-03-04 DIAGNOSIS — E0590 Thyrotoxicosis, unspecified without thyrotoxic crisis or storm: Secondary | ICD-10-CM | POA: Diagnosis not present

## 2022-03-04 DIAGNOSIS — Z792 Long term (current) use of antibiotics: Secondary | ICD-10-CM | POA: Diagnosis not present

## 2022-03-04 DIAGNOSIS — J690 Pneumonitis due to inhalation of food and vomit: Secondary | ICD-10-CM | POA: Diagnosis not present

## 2022-03-04 DIAGNOSIS — F13939 Sedative, hypnotic or anxiolytic use, unspecified with withdrawal, unspecified: Secondary | ICD-10-CM | POA: Diagnosis not present

## 2022-04-17 DIAGNOSIS — Z961 Presence of intraocular lens: Secondary | ICD-10-CM | POA: Diagnosis not present

## 2022-04-17 DIAGNOSIS — H402213 Chronic angle-closure glaucoma, right eye, severe stage: Secondary | ICD-10-CM | POA: Diagnosis not present

## 2022-04-17 DIAGNOSIS — H40002 Preglaucoma, unspecified, left eye: Secondary | ICD-10-CM | POA: Diagnosis not present

## 2022-06-01 ENCOUNTER — Emergency Department (HOSPITAL_COMMUNITY)
Admission: EM | Admit: 2022-06-01 | Discharge: 2022-06-02 | Disposition: A | Discharge status: Home / Self Care | Payer: Medicare HMO | Attending: Emergency Medicine | Admitting: Emergency Medicine

## 2022-06-01 ENCOUNTER — Emergency Department (HOSPITAL_COMMUNITY): Payer: Medicare HMO

## 2022-06-01 ENCOUNTER — Encounter (HOSPITAL_COMMUNITY): Payer: Self-pay | Admitting: Emergency Medicine

## 2022-06-01 ENCOUNTER — Other Ambulatory Visit: Payer: Self-pay

## 2022-06-01 DIAGNOSIS — S81812A Laceration without foreign body, left lower leg, initial encounter: Secondary | ICD-10-CM | POA: Insufficient documentation

## 2022-06-01 DIAGNOSIS — S8992XA Unspecified injury of left lower leg, initial encounter: Secondary | ICD-10-CM | POA: Diagnosis present

## 2022-06-01 DIAGNOSIS — S60212A Contusion of left wrist, initial encounter: Secondary | ICD-10-CM | POA: Insufficient documentation

## 2022-06-01 DIAGNOSIS — S60211A Contusion of right wrist, initial encounter: Secondary | ICD-10-CM | POA: Diagnosis not present

## 2022-06-01 DIAGNOSIS — I10 Essential (primary) hypertension: Secondary | ICD-10-CM | POA: Insufficient documentation

## 2022-06-01 DIAGNOSIS — Y990 Civilian activity done for income or pay: Secondary | ICD-10-CM | POA: Diagnosis not present

## 2022-06-01 DIAGNOSIS — W5581XA Bitten by other mammals, initial encounter: Secondary | ICD-10-CM | POA: Diagnosis not present

## 2022-06-01 DIAGNOSIS — I959 Hypotension, unspecified: Secondary | ICD-10-CM | POA: Insufficient documentation

## 2022-06-01 DIAGNOSIS — Y92007 Garden or yard of unspecified non-institutional (private) residence as the place of occurrence of the external cause: Secondary | ICD-10-CM | POA: Insufficient documentation

## 2022-06-01 DIAGNOSIS — Z7982 Long term (current) use of aspirin: Secondary | ICD-10-CM | POA: Diagnosis not present

## 2022-06-01 DIAGNOSIS — Z23 Encounter for immunization: Secondary | ICD-10-CM | POA: Diagnosis not present

## 2022-06-01 DIAGNOSIS — S81052A Open bite, left knee, initial encounter: Secondary | ICD-10-CM | POA: Diagnosis not present

## 2022-06-01 DIAGNOSIS — R609 Edema, unspecified: Secondary | ICD-10-CM | POA: Diagnosis not present

## 2022-06-01 DIAGNOSIS — W540XXA Bitten by dog, initial encounter: Secondary | ICD-10-CM | POA: Insufficient documentation

## 2022-06-01 DIAGNOSIS — R58 Hemorrhage, not elsewhere classified: Secondary | ICD-10-CM | POA: Diagnosis not present

## 2022-06-01 DIAGNOSIS — S81852A Open bite, left lower leg, initial encounter: Secondary | ICD-10-CM | POA: Diagnosis not present

## 2022-06-01 DIAGNOSIS — Z79899 Other long term (current) drug therapy: Secondary | ICD-10-CM | POA: Diagnosis not present

## 2022-06-01 DIAGNOSIS — S79929A Unspecified injury of unspecified thigh, initial encounter: Secondary | ICD-10-CM | POA: Diagnosis not present

## 2022-06-01 HISTORY — DX: Essential (primary) hypertension: I10

## 2022-06-01 MED ORDER — TETANUS-DIPHTH-ACELL PERTUSSIS 5-2.5-18.5 LF-MCG/0.5 IM SUSY
0.5000 mL | PREFILLED_SYRINGE | Freq: Once | INTRAMUSCULAR | Status: AC
Start: 2022-06-01 — End: 2022-06-01
  Administered 2022-06-01: 0.5 mL via INTRAMUSCULAR
  Filled 2022-06-01: qty 0.5

## 2022-06-01 MED ORDER — SODIUM CHLORIDE 0.9 % IV SOLN
3.0000 g | Freq: Once | INTRAVENOUS | Status: AC
  Administered 2022-06-01: 3 g via INTRAVENOUS
  Filled 2022-06-01: qty 8

## 2022-06-01 MED ORDER — AMOXICILLIN-POT CLAVULANATE 875-125 MG PO TABS: 1.0000 | ORAL_TABLET | Freq: Two times a day (BID) | ORAL | 0 refills | Status: AC

## 2022-06-01 MED ORDER — SODIUM CHLORIDE 0.9 % IV BOLUS
1000.0000 mL | Freq: Once | INTRAVENOUS | Status: AC
  Administered 2022-06-01: 1000 mL via INTRAVENOUS

## 2022-06-01 MED ORDER — OXYCODONE HCL 5 MG PO TABS: 5.0000 mg | ORAL_TABLET | Freq: Four times a day (QID) | ORAL | 0 refills | Status: AC | PRN

## 2022-06-01 MED ORDER — ONDANSETRON HCL 4 MG/2ML IJ SOLN
4.0000 mg | Freq: Once | INTRAMUSCULAR | Status: AC
  Administered 2022-06-01: 4 mg via INTRAVENOUS
  Filled 2022-06-01: qty 2

## 2022-06-01 MED ORDER — FENTANYL CITRATE PF 50 MCG/ML IJ SOSY
25.0000 ug | PREFILLED_SYRINGE | Freq: Once | INTRAMUSCULAR | Status: AC
  Administered 2022-06-01: 25 ug via INTRAVENOUS
  Filled 2022-06-01: qty 1

## 2022-06-01 MED ORDER — ONDANSETRON HCL 4 MG PO TABS: 4.0000 mg | ORAL_TABLET | Freq: Four times a day (QID) | ORAL | 0 refills | Status: AC

## 2022-06-01 MED ORDER — LIDOCAINE-EPINEPHRINE (PF) 2 %-1:200000 IJ SOLN
30.0000 mL | Freq: Once | INTRAMUSCULAR | Status: AC
  Administered 2022-06-01: 30 mL
  Filled 2022-06-01: qty 40
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
## 2022-06-01 NOTE — Discharge Instructions (Addendum)
Please leave the dressing that we have placed today for 24 hours, and then move the dressing, clean thoroughly with soap and water, and redress with a thin layer of Polysporin or Neosporin directly on top of all of the cuts and skin tears.  I want you to treat all of your wounds including cuts and scrapes this way not just the lacerations that we repaired with sutures.  As we discussed the sutures are not dissolvable, they will need to be removed in around 7 to 10 days.  I would err on the side of 10 days.  Please monitor closely for signs of infection including worsening redness, swelling, pus draining from the affected sites, tracking redness down the foot, or worsening pain.  I do recommend you do some range of motion exercises of the affected extremity, you may have some difficulty walking secondary to muscular pain but it is okay to bear weight if you can tolerate it.  Please use Tylenol or ibuprofen for pain.  You may use 600 mg ibuprofen every 6 hours or 1000 mg of Tylenol every 6 hours.  You may choose to alternate between the 2.  This would be most effective.  Not to exceed 4 g of Tylenol within 24 hours.  Not to exceed 3200 mg ibuprofen 24 hours.  You can use the narcotic medication that I have prescribed in addition to the above up to every 6 hours for any worsening or breakthrough pain.  Please follow-up with your PCP in the wound care clinic as we discussed.
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
## 2022-06-05 DIAGNOSIS — E0590 Thyrotoxicosis, unspecified without thyrotoxic crisis or storm: Secondary | ICD-10-CM | POA: Diagnosis not present

## 2022-06-05 DIAGNOSIS — I10 Essential (primary) hypertension: Secondary | ICD-10-CM | POA: Diagnosis not present

## 2022-06-05 DIAGNOSIS — Z79899 Other long term (current) drug therapy: Secondary | ICD-10-CM | POA: Diagnosis not present

## 2022-06-05 DIAGNOSIS — M79605 Pain in left leg: Secondary | ICD-10-CM | POA: Diagnosis not present

## 2022-06-05 DIAGNOSIS — W540XXA Bitten by dog, initial encounter: Secondary | ICD-10-CM | POA: Diagnosis not present

## 2022-06-05 DIAGNOSIS — S81812A Laceration without foreign body, left lower leg, initial encounter: Secondary | ICD-10-CM | POA: Diagnosis not present

## 2022-06-05 DIAGNOSIS — F419 Anxiety disorder, unspecified: Secondary | ICD-10-CM | POA: Diagnosis not present

## 2022-06-12 DIAGNOSIS — S81852A Open bite, left lower leg, initial encounter: Secondary | ICD-10-CM | POA: Diagnosis not present

## 2022-06-12 DIAGNOSIS — W540XXA Bitten by dog, initial encounter: Secondary | ICD-10-CM | POA: Diagnosis not present

## 2022-06-19 DIAGNOSIS — W540XXA Bitten by dog, initial encounter: Secondary | ICD-10-CM | POA: Diagnosis not present

## 2022-06-19 DIAGNOSIS — S81852A Open bite, left lower leg, initial encounter: Secondary | ICD-10-CM | POA: Diagnosis not present

## 2022-06-24 DIAGNOSIS — R7989 Other specified abnormal findings of blood chemistry: Secondary | ICD-10-CM | POA: Diagnosis not present

## 2022-06-26 DIAGNOSIS — S81852A Open bite, left lower leg, initial encounter: Secondary | ICD-10-CM | POA: Diagnosis not present

## 2022-06-26 DIAGNOSIS — W540XXA Bitten by dog, initial encounter: Secondary | ICD-10-CM | POA: Diagnosis not present

## 2022-07-12 DIAGNOSIS — F17210 Nicotine dependence, cigarettes, uncomplicated: Secondary | ICD-10-CM | POA: Diagnosis not present

## 2022-07-12 DIAGNOSIS — I10 Essential (primary) hypertension: Secondary | ICD-10-CM | POA: Diagnosis not present

## 2022-07-12 DIAGNOSIS — S81852A Open bite, left lower leg, initial encounter: Secondary | ICD-10-CM | POA: Diagnosis not present

## 2022-07-15 DIAGNOSIS — S81852A Open bite, left lower leg, initial encounter: Secondary | ICD-10-CM | POA: Diagnosis not present

## 2022-07-15 DIAGNOSIS — W540XXA Bitten by dog, initial encounter: Secondary | ICD-10-CM | POA: Diagnosis not present

## 2022-07-16 DIAGNOSIS — S81852A Open bite, left lower leg, initial encounter: Secondary | ICD-10-CM | POA: Diagnosis not present

## 2022-07-18 DIAGNOSIS — S81852A Open bite, left lower leg, initial encounter: Secondary | ICD-10-CM | POA: Diagnosis not present

## 2022-07-23 DIAGNOSIS — S81852A Open bite, left lower leg, initial encounter: Secondary | ICD-10-CM | POA: Diagnosis not present

## 2022-07-30 DIAGNOSIS — S81852A Open bite, left lower leg, initial encounter: Secondary | ICD-10-CM | POA: Diagnosis not present

## 2022-07-30 DIAGNOSIS — W540XXA Bitten by dog, initial encounter: Secondary | ICD-10-CM | POA: Diagnosis not present

## 2022-08-08 DIAGNOSIS — W540XXA Bitten by dog, initial encounter: Secondary | ICD-10-CM | POA: Diagnosis not present

## 2022-08-08 DIAGNOSIS — S81852A Open bite, left lower leg, initial encounter: Secondary | ICD-10-CM | POA: Diagnosis not present

## 2022-08-15 DIAGNOSIS — S81852A Open bite, left lower leg, initial encounter: Secondary | ICD-10-CM | POA: Diagnosis not present

## 2022-08-15 DIAGNOSIS — W540XXA Bitten by dog, initial encounter: Secondary | ICD-10-CM | POA: Diagnosis not present

## 2022-08-16 DIAGNOSIS — S81852A Open bite, left lower leg, initial encounter: Secondary | ICD-10-CM | POA: Diagnosis not present

## 2022-08-19 DIAGNOSIS — E0590 Thyrotoxicosis, unspecified without thyrotoxic crisis or storm: Secondary | ICD-10-CM | POA: Diagnosis not present

## 2022-08-22 DIAGNOSIS — W540XXA Bitten by dog, initial encounter: Secondary | ICD-10-CM | POA: Diagnosis not present

## 2022-08-22 DIAGNOSIS — S81852A Open bite, left lower leg, initial encounter: Secondary | ICD-10-CM | POA: Diagnosis not present

## 2022-08-29 DIAGNOSIS — W540XXA Bitten by dog, initial encounter: Secondary | ICD-10-CM | POA: Diagnosis not present

## 2022-08-29 DIAGNOSIS — S81852A Open bite, left lower leg, initial encounter: Secondary | ICD-10-CM | POA: Diagnosis not present

## 2022-09-11 DIAGNOSIS — W540XXA Bitten by dog, initial encounter: Secondary | ICD-10-CM | POA: Diagnosis not present

## 2022-09-11 DIAGNOSIS — S81852A Open bite, left lower leg, initial encounter: Secondary | ICD-10-CM | POA: Diagnosis not present

## 2022-09-23 DIAGNOSIS — S81852A Open bite, left lower leg, initial encounter: Secondary | ICD-10-CM | POA: Diagnosis not present

## 2022-09-23 DIAGNOSIS — W540XXA Bitten by dog, initial encounter: Secondary | ICD-10-CM | POA: Diagnosis not present

## 2022-09-30 DIAGNOSIS — S81852A Open bite, left lower leg, initial encounter: Secondary | ICD-10-CM | POA: Diagnosis not present

## 2022-12-13 DIAGNOSIS — H402213 Chronic angle-closure glaucoma, right eye, severe stage: Secondary | ICD-10-CM | POA: Diagnosis not present

## 2022-12-13 DIAGNOSIS — H402221 Chronic angle-closure glaucoma, left eye, mild stage: Secondary | ICD-10-CM | POA: Diagnosis not present

## 2022-12-26 IMAGING — DX DG KNEE 1-2V*L*
1 series · 2 of 2 positions shown · non-contrast
Comparison: None Available.

CLINICAL DATA: Initial evaluation for acute trauma, dog bite.

EXAM:
LEFT KNEE - 1-2 VIEW

[Series 1: knee · 0.14mm/px · 2 of 2 slices shown]
[im 1/2]
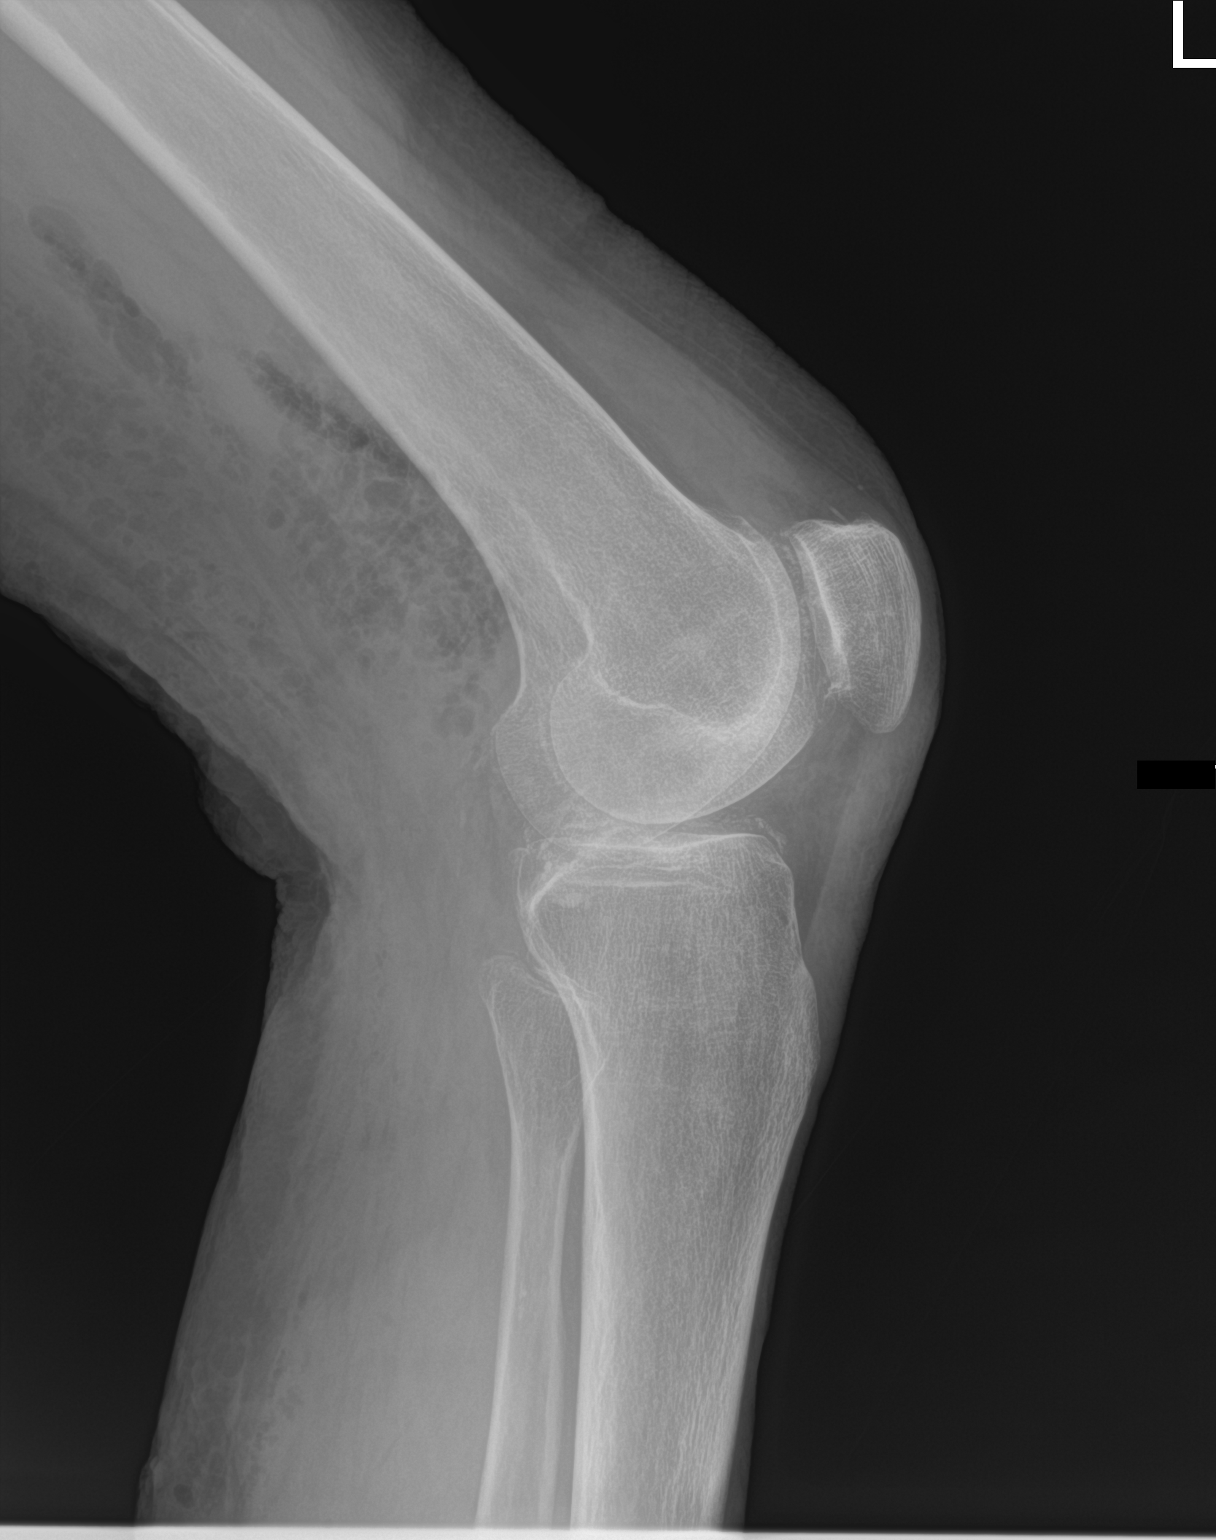
[im 2/2]
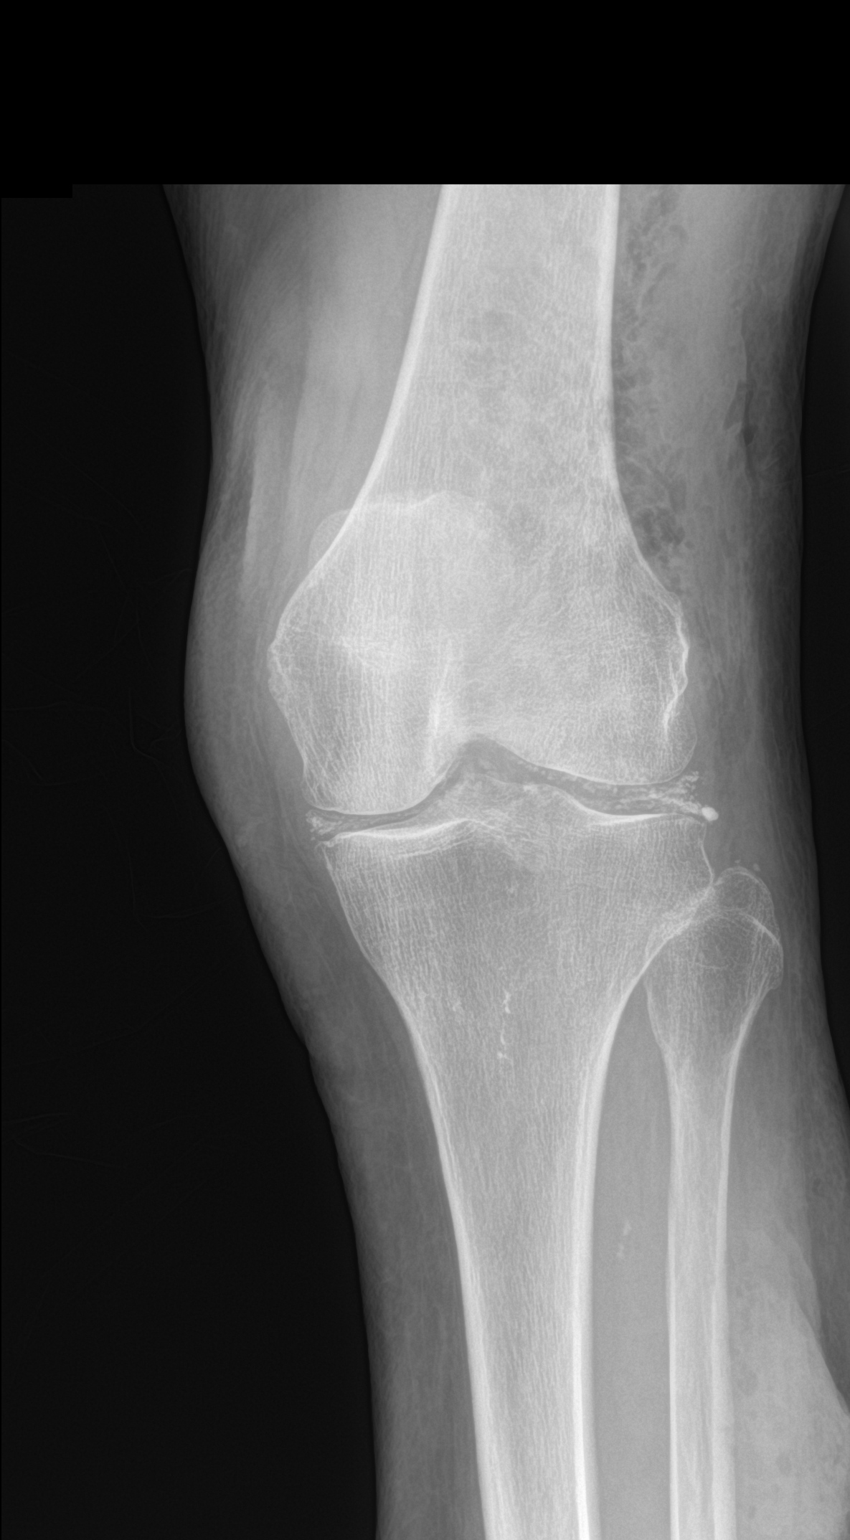

[2 of 2 positions shown; findings below may reference images not displayed]

FINDINGS: Scattered soft tissue emphysema seen throughout the posterior soft
tissues of the distal thigh and proximal leg, consistent with
history of soft tissue injury/dog bite. No radiopaque foreign body.
No acute fracture dislocation. No joint effusion. Moderately
advanced degenerative osteoarthrosis with chondrocalcinosis.
IMPRESSION: 1. Scattered soft tissue emphysema throughout the posterior soft
tissues of the distal thigh and proximal leg, consistent with
history of soft tissue injury/dog bite. No radiopaque foreign body.
2. No acute osseous abnormality.

## 2022-12-26 IMAGING — DX DG TIBIA/FIBULA 2V*L*
1 series · 4 of 4 positions shown · non-contrast
Comparison: None Available.

CLINICAL DATA: Initial evaluation for acute trauma, dog bite.

EXAM:
LEFT TIBIA AND FIBULA - 2 VIEW

[Series 1: leg · 0.14mm/px · 4 of 4 slices shown]
[im 1/4]
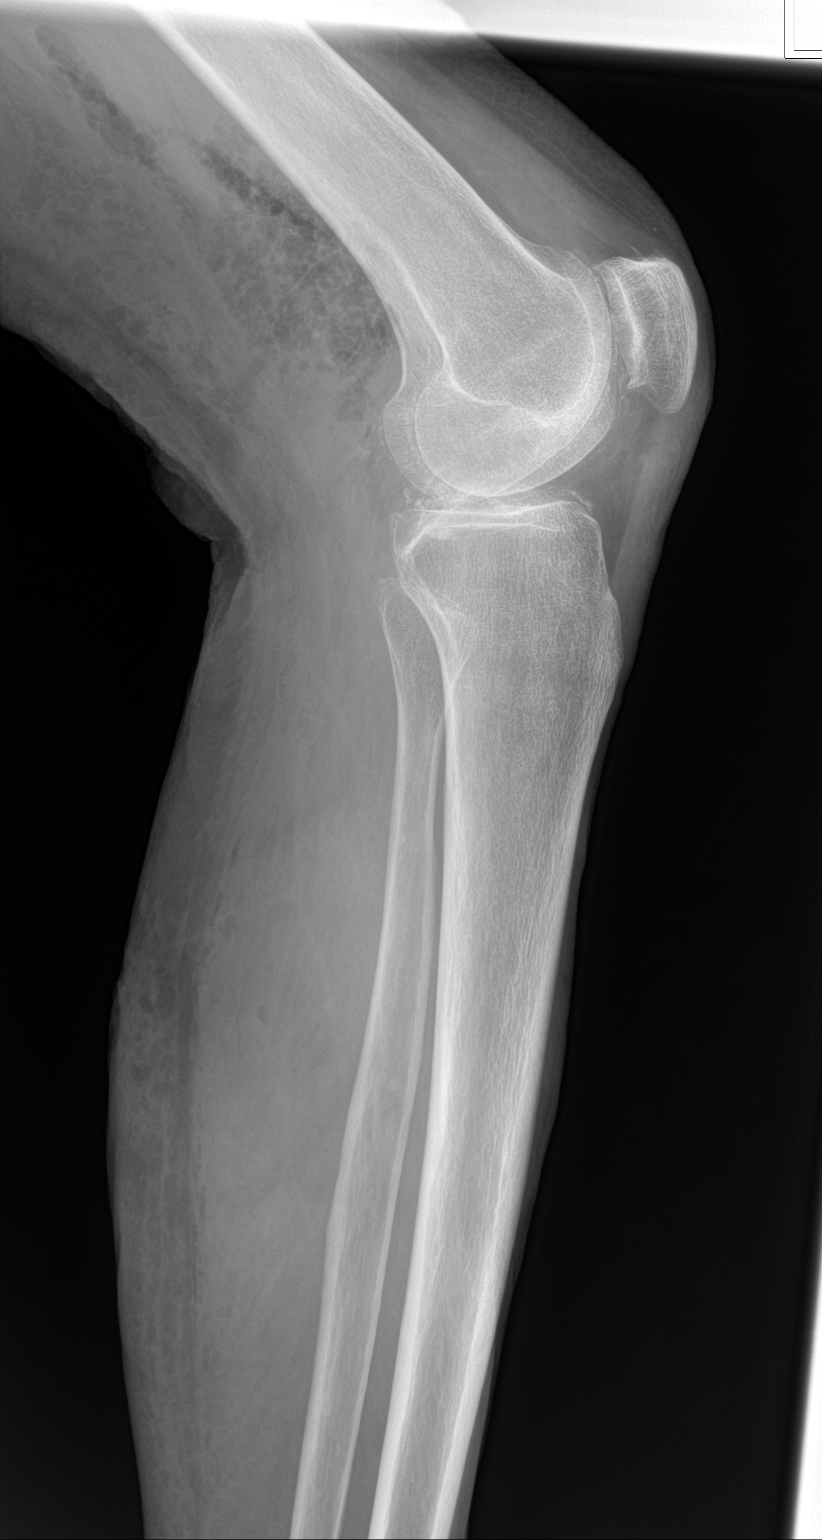
[im 2/4]
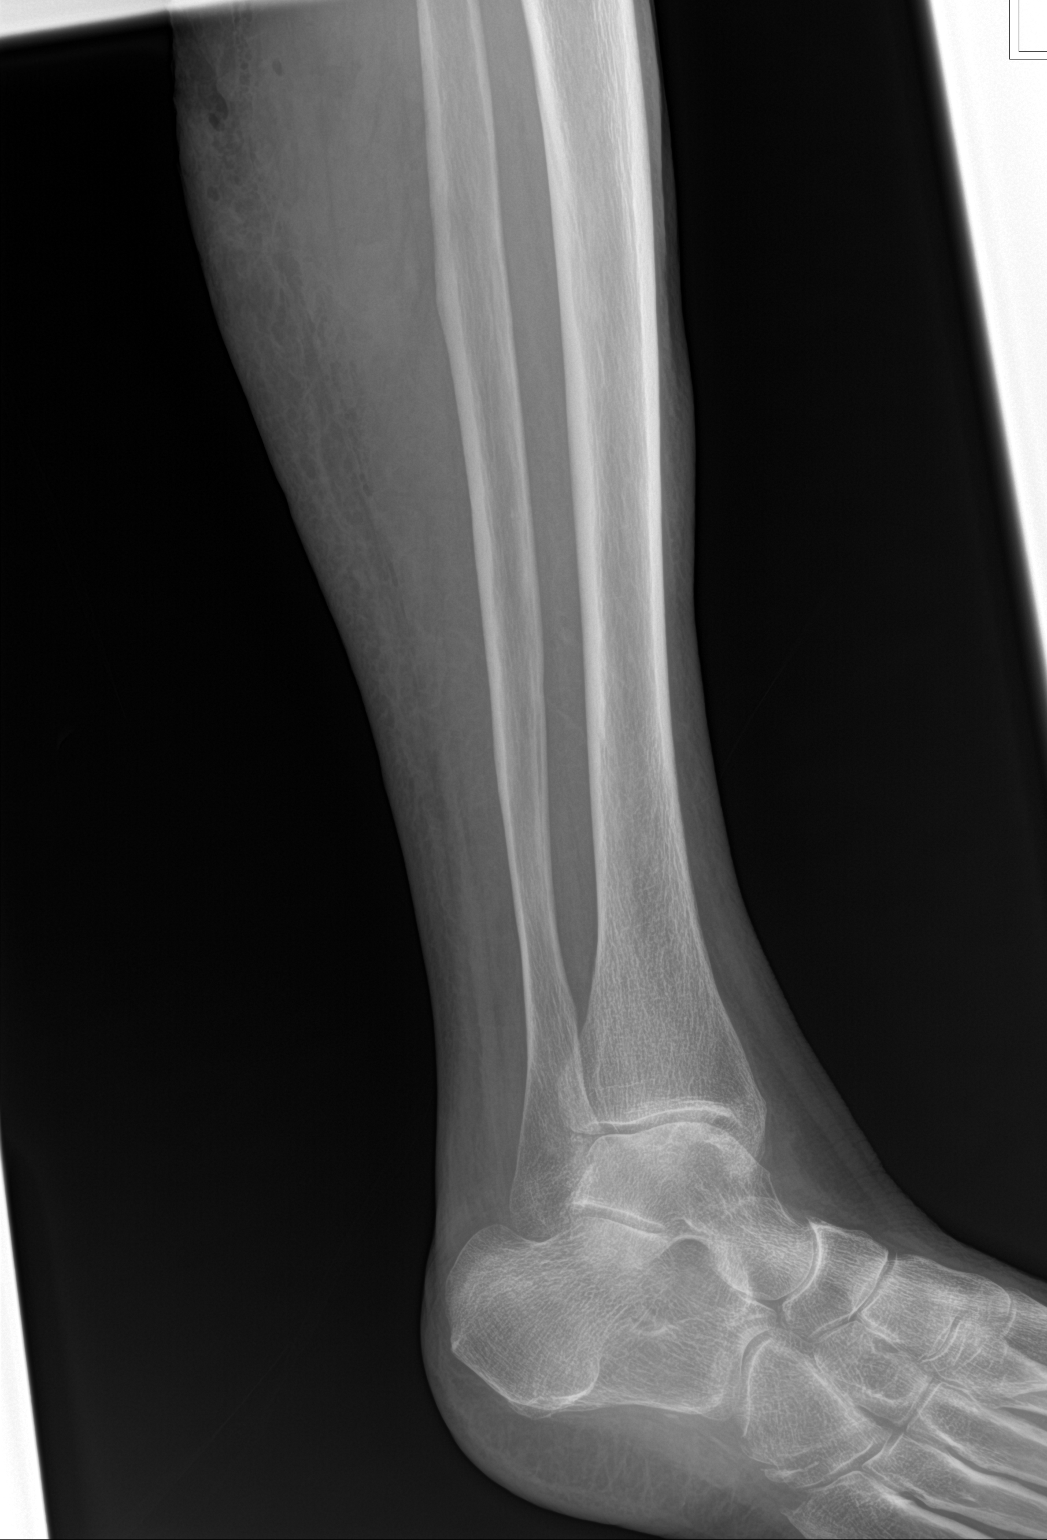
[im 3/4]
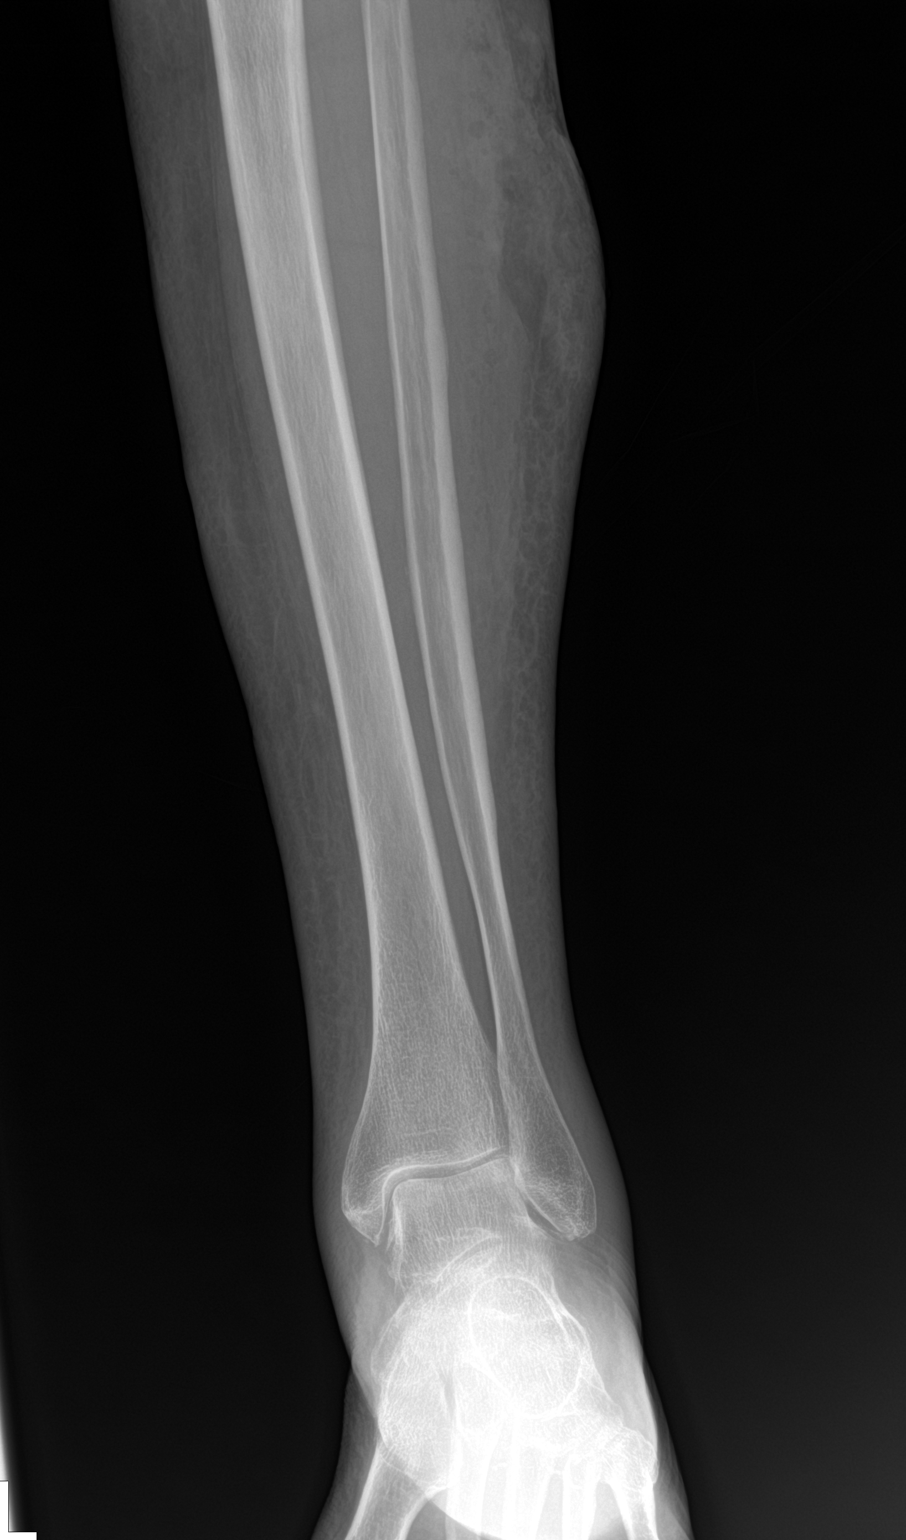
[im 4/4]
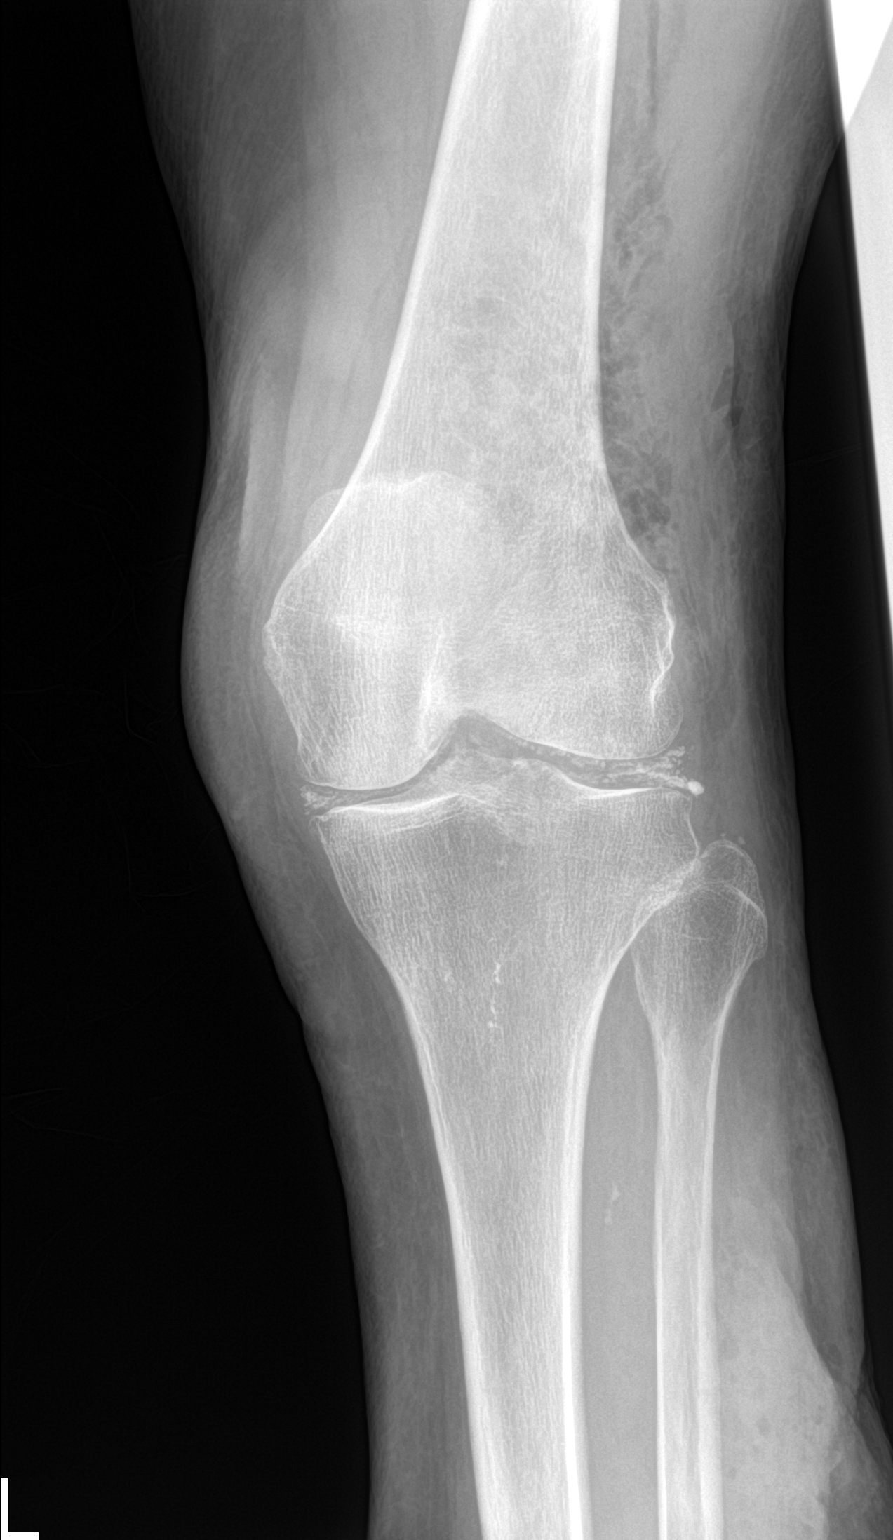

[4 of 4 positions shown; findings below may reference images not displayed]

FINDINGS: Scattered soft tissue irregularity and emphysema within the
posterior and lateral soft tissues of the distal thigh and leg,
consistent with history of dog bite. No radiopaque foreign body. No
acute fracture or dislocation. Prominent osteoarthritic changes
noted about the knee.
IMPRESSION: 1. Scattered soft tissue irregularity and emphysema within the
posterior and lateral soft tissues of the distal thigh and leg,
consistent with history of dog bite. No radiopaque foreign body.
2. No acute fracture or dislocation.

## 2023-01-16 DIAGNOSIS — Z0000 Encounter for general adult medical examination without abnormal findings: Secondary | ICD-10-CM | POA: Diagnosis not present

## 2023-01-16 DIAGNOSIS — E0590 Thyrotoxicosis, unspecified without thyrotoxic crisis or storm: Secondary | ICD-10-CM | POA: Diagnosis not present

## 2023-01-16 DIAGNOSIS — M79605 Pain in left leg: Secondary | ICD-10-CM | POA: Diagnosis not present

## 2023-01-16 DIAGNOSIS — I10 Essential (primary) hypertension: Secondary | ICD-10-CM | POA: Diagnosis not present

## 2023-01-16 DIAGNOSIS — Z1331 Encounter for screening for depression: Secondary | ICD-10-CM | POA: Diagnosis not present

## 2023-01-16 DIAGNOSIS — Z79899 Other long term (current) drug therapy: Secondary | ICD-10-CM | POA: Diagnosis not present

## 2023-01-16 DIAGNOSIS — Z1231 Encounter for screening mammogram for malignant neoplasm of breast: Secondary | ICD-10-CM | POA: Diagnosis not present

## 2023-01-21 DIAGNOSIS — L821 Other seborrheic keratosis: Secondary | ICD-10-CM | POA: Diagnosis not present

## 2023-01-21 DIAGNOSIS — L578 Other skin changes due to chronic exposure to nonionizing radiation: Secondary | ICD-10-CM | POA: Diagnosis not present

## 2023-01-21 DIAGNOSIS — C44722 Squamous cell carcinoma of skin of right lower limb, including hip: Secondary | ICD-10-CM | POA: Diagnosis not present

## 2023-01-21 DIAGNOSIS — L570 Actinic keratosis: Secondary | ICD-10-CM | POA: Diagnosis not present

## 2023-02-11 DIAGNOSIS — Z96651 Presence of right artificial knee joint: Secondary | ICD-10-CM | POA: Diagnosis not present

## 2023-02-11 DIAGNOSIS — M1711 Unilateral primary osteoarthritis, right knee: Secondary | ICD-10-CM | POA: Diagnosis not present

## 2023-03-25 DIAGNOSIS — Z1231 Encounter for screening mammogram for malignant neoplasm of breast: Secondary | ICD-10-CM | POA: Diagnosis not present

## 2023-04-17 DIAGNOSIS — M549 Dorsalgia, unspecified: Secondary | ICD-10-CM | POA: Diagnosis not present

## 2023-04-17 DIAGNOSIS — M62838 Other muscle spasm: Secondary | ICD-10-CM | POA: Diagnosis not present

## 2023-04-17 DIAGNOSIS — I10 Essential (primary) hypertension: Secondary | ICD-10-CM | POA: Diagnosis not present

## 2023-04-17 DIAGNOSIS — E0590 Thyrotoxicosis, unspecified without thyrotoxic crisis or storm: Secondary | ICD-10-CM | POA: Diagnosis not present

## 2023-04-17 DIAGNOSIS — M25561 Pain in right knee: Secondary | ICD-10-CM | POA: Diagnosis not present

## 2023-04-17 DIAGNOSIS — Z6820 Body mass index (BMI) 20.0-20.9, adult: Secondary | ICD-10-CM | POA: Diagnosis not present

## 2023-04-17 DIAGNOSIS — F419 Anxiety disorder, unspecified: Secondary | ICD-10-CM | POA: Diagnosis not present

## 2023-05-07 DIAGNOSIS — H402213 Chronic angle-closure glaucoma, right eye, severe stage: Secondary | ICD-10-CM | POA: Diagnosis not present

## 2023-07-24 DIAGNOSIS — E0590 Thyrotoxicosis, unspecified without thyrotoxic crisis or storm: Secondary | ICD-10-CM | POA: Diagnosis not present

## 2023-07-24 DIAGNOSIS — Z79899 Other long term (current) drug therapy: Secondary | ICD-10-CM | POA: Diagnosis not present

## 2023-07-24 DIAGNOSIS — F419 Anxiety disorder, unspecified: Secondary | ICD-10-CM | POA: Diagnosis not present

## 2023-07-24 DIAGNOSIS — I10 Essential (primary) hypertension: Secondary | ICD-10-CM | POA: Diagnosis not present

## 2023-10-08 DIAGNOSIS — R1013 Epigastric pain: Secondary | ICD-10-CM | POA: Diagnosis not present

## 2023-10-09 DIAGNOSIS — R1084 Generalized abdominal pain: Secondary | ICD-10-CM | POA: Diagnosis not present

## 2023-10-14 DIAGNOSIS — N281 Cyst of kidney, acquired: Secondary | ICD-10-CM | POA: Diagnosis not present

## 2023-10-14 DIAGNOSIS — I10 Essential (primary) hypertension: Secondary | ICD-10-CM | POA: Diagnosis not present

## 2023-10-14 DIAGNOSIS — R110 Nausea: Secondary | ICD-10-CM | POA: Diagnosis not present

## 2023-10-14 DIAGNOSIS — R197 Diarrhea, unspecified: Secondary | ICD-10-CM | POA: Diagnosis not present

## 2023-10-14 DIAGNOSIS — R112 Nausea with vomiting, unspecified: Secondary | ICD-10-CM | POA: Diagnosis not present

## 2023-10-14 DIAGNOSIS — K8689 Other specified diseases of pancreas: Secondary | ICD-10-CM | POA: Diagnosis not present

## 2023-10-23 DIAGNOSIS — K5900 Constipation, unspecified: Secondary | ICD-10-CM | POA: Diagnosis not present

## 2023-10-23 DIAGNOSIS — R110 Nausea: Secondary | ICD-10-CM | POA: Diagnosis not present

## 2023-10-23 DIAGNOSIS — R5383 Other fatigue: Secondary | ICD-10-CM | POA: Diagnosis not present

## 2023-10-23 DIAGNOSIS — K8689 Other specified diseases of pancreas: Secondary | ICD-10-CM | POA: Diagnosis not present

## 2023-10-23 DIAGNOSIS — R1084 Generalized abdominal pain: Secondary | ICD-10-CM | POA: Diagnosis not present

## 2023-10-24 DIAGNOSIS — K8689 Other specified diseases of pancreas: Secondary | ICD-10-CM | POA: Diagnosis not present

## 2023-10-24 DIAGNOSIS — R112 Nausea with vomiting, unspecified: Secondary | ICD-10-CM | POA: Diagnosis not present

## 2023-12-17 DIAGNOSIS — H524 Presbyopia: Secondary | ICD-10-CM | POA: Diagnosis not present

## 2023-12-17 DIAGNOSIS — H402213 Chronic angle-closure glaucoma, right eye, severe stage: Secondary | ICD-10-CM | POA: Diagnosis not present

## 2023-12-30 DIAGNOSIS — K5900 Constipation, unspecified: Secondary | ICD-10-CM | POA: Diagnosis not present

## 2023-12-30 DIAGNOSIS — Z681 Body mass index (BMI) 19 or less, adult: Secondary | ICD-10-CM | POA: Diagnosis not present

## 2023-12-30 DIAGNOSIS — R1084 Generalized abdominal pain: Secondary | ICD-10-CM | POA: Diagnosis not present

## 2023-12-30 DIAGNOSIS — R634 Abnormal weight loss: Secondary | ICD-10-CM | POA: Diagnosis not present

## 2023-12-30 DIAGNOSIS — I10 Essential (primary) hypertension: Secondary | ICD-10-CM | POA: Diagnosis not present

## 2023-12-30 DIAGNOSIS — E0590 Thyrotoxicosis, unspecified without thyrotoxic crisis or storm: Secondary | ICD-10-CM | POA: Diagnosis not present

## 2024-01-27 DIAGNOSIS — F419 Anxiety disorder, unspecified: Secondary | ICD-10-CM | POA: Diagnosis not present

## 2024-01-27 DIAGNOSIS — Z0000 Encounter for general adult medical examination without abnormal findings: Secondary | ICD-10-CM | POA: Diagnosis not present

## 2024-01-27 DIAGNOSIS — I10 Essential (primary) hypertension: Secondary | ICD-10-CM | POA: Diagnosis not present

## 2024-01-27 DIAGNOSIS — R1084 Generalized abdominal pain: Secondary | ICD-10-CM | POA: Diagnosis not present

## 2024-01-27 DIAGNOSIS — R059 Cough, unspecified: Secondary | ICD-10-CM | POA: Diagnosis not present

## 2024-01-27 DIAGNOSIS — E0590 Thyrotoxicosis, unspecified without thyrotoxic crisis or storm: Secondary | ICD-10-CM | POA: Diagnosis not present

## 2024-01-27 DIAGNOSIS — Z7189 Other specified counseling: Secondary | ICD-10-CM | POA: Diagnosis not present

## 2024-01-27 DIAGNOSIS — Z1331 Encounter for screening for depression: Secondary | ICD-10-CM | POA: Diagnosis not present

## 2024-04-01 DIAGNOSIS — H524 Presbyopia: Secondary | ICD-10-CM | POA: Diagnosis not present

## 2024-04-26 DIAGNOSIS — I10 Essential (primary) hypertension: Secondary | ICD-10-CM | POA: Diagnosis not present

## 2024-04-26 DIAGNOSIS — Z79899 Other long term (current) drug therapy: Secondary | ICD-10-CM | POA: Diagnosis not present

## 2024-04-26 DIAGNOSIS — K8689 Other specified diseases of pancreas: Secondary | ICD-10-CM | POA: Diagnosis not present

## 2024-04-26 DIAGNOSIS — K5900 Constipation, unspecified: Secondary | ICD-10-CM | POA: Diagnosis not present

## 2024-04-26 DIAGNOSIS — E0590 Thyrotoxicosis, unspecified without thyrotoxic crisis or storm: Secondary | ICD-10-CM | POA: Diagnosis not present

## 2024-04-26 DIAGNOSIS — F419 Anxiety disorder, unspecified: Secondary | ICD-10-CM | POA: Diagnosis not present

## 2024-05-19 ENCOUNTER — Ambulatory Visit: Admitting: Gastroenterology

## 2024-05-19 NOTE — Progress Notes (Deleted)
 Chief Complaint:Dilated pancreatic duct  Primary GI Doctor: Dr. Brice Campi  HPI:  Patient is a  73  year old female patient with past medical history of hypertension*****who was referred to me by Olan Bering, MD on 04/27/24 for a complaint of Dilated pancreatic duct  .    Interval History  Patient admits/denies GERD Patient admits/denies dysphagia Patient admits/denies nausea, vomiting, or weight loss  Patient admits/denies altered bowel habits Patient admits/denies abdominal pain Patient admits/denies rectal bleeding   Denies/Admits alcohol  Denies/Admits smoking Denies/Admits NSAID use. Denies/Admits they are on blood thinners.  Ask for symptoms of chronic pancreatitis.   Patients last colonoscopy Patients last EGD  Patient's family history includes  Wt Readings from Last 3 Encounters:  06/01/22 115 lb (52.2 kg)      Past Medical History:  Diagnosis Date   Hypertension     No past surgical history on file.  Current Outpatient Medications  Medication Sig Dispense Refill   ALPRAZolam (XANAX) 1 MG tablet Take 0.5-3 mg by mouth See admin instructions. Take 1-3 mg by mouth at bedtime and 0.5-1 mg during the daytime as needed for anxiety     amLODipine (NORVASC) 5 MG tablet Take 5 mg by mouth daily.     amoxicillin -clavulanate (AUGMENTIN ) 875-125 MG tablet Take 1 tablet by mouth every 12 (twelve) hours. 14 tablet 0   aspirin EC 81 MG tablet Take 81 mg by mouth daily. Swallow whole.     atenolol (TENORMIN) 50 MG tablet Take 50 mg by mouth daily.     colestipol (COLESTID) 1 g tablet Take 1 g by mouth daily before breakfast.     gabapentin (NEURONTIN) 300 MG capsule Take 300 mg by mouth 3 (three) times daily as needed (for neuropathy).     latanoprost (XALATAN) 0.005 % ophthalmic solution Place 1 drop into both eyes at bedtime.     methimazole (TAPAZOLE) 5 MG tablet Take 5 mg by mouth in the morning.     ondansetron  (ZOFRAN ) 4 MG tablet Take 1 tablet (4 mg total)  by mouth every 6 (six) hours. 12 tablet 0   oxyCODONE  (ROXICODONE ) 5 MG immediate release tablet Take 1 tablet (5 mg total) by mouth every 6 (six) hours as needed for severe pain. 15 tablet 0   No current facility-administered medications for this visit.    Allergies as of 05/19/2024 - Review Complete 06/01/2022  Allergen Reaction Noted   Codeine Nausea And Vomiting 06/01/2022    No family history on file.  Review of Systems:    Constitutional: No weight loss, fever, chills, weakness or fatigue HEENT: Eyes: No change in vision               Ears, Nose, Throat:  No change in hearing or congestion Skin: No rash or itching Cardiovascular: No chest pain, chest pressure or palpitations   Respiratory: No SOB or cough Gastrointestinal: See HPI and otherwise negative Genitourinary: No dysuria or change in urinary frequency Neurological: No headache, dizziness or syncope Musculoskeletal: No new muscle or joint pain Hematologic: No bleeding or bruising Psychiatric: No history of depression or anxiety    Physical Exam:  Vital signs: There were no vitals taken for this visit.  Constitutional:   Pleasant *** female appears to be in NAD, Well developed, Well nourished, alert and cooperative Head:  Normocephalic and atraumatic. Eyes:   PEERL, EOMI. No icterus. Conjunctiva pink. Ears:  Normal auditory acuity. Neck:  Supple Throat: Oral cavity and pharynx without inflammation, swelling or lesion.  Respiratory: Respirations even and unlabored. Lungs clear to auscultation bilaterally.   No wheezes, crackles, or rhonchi.  Cardiovascular: Normal S1, S2. Regular rate and rhythm. No peripheral edema, cyanosis or pallor.  Gastrointestinal:  Soft, nondistended, nontender. No rebound or guarding. Normal bowel sounds. No appreciable masses or hepatomegaly. Rectal:  Not performed.  Anoscopy: Msk:  Symmetrical without gross deformities. Without edema, no deformity or joint abnormality.  Neurologic:   Alert and  oriented x4;  grossly normal neurologically.  Skin:   Dry and intact without significant lesions or rashes. Psychiatric: Oriented to person, place and time. Demonstrates good judgement and reason without abnormal affect or behaviors.  RELEVANT LABS AND IMAGING:   (media tab) 10/28/23 labs show: lipase 25, amylase 51,BUN 19, Creat 1.18, normal lft's, wbc 8.9, hgb 14.1, plt 279  Imaging: 10/14/23 CT Scan abd/pelvis with contrast Pancreatic ductal dilatation is noted measuring up to 6mm. Is seen extending 2.61mm enhancing structure in pancreatic head. MRI recommended to evaluate possible neoplasm. 10/24/23 MRI abd Moderate pancreatic ductal dilatation is again seen throughout the body and tail measuring up to 7mm in diameter. 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 No evidence of peripancreatic inflammatory changes or fluid collections. Differential diagnoses includes benign stricture related to chronic pancreatitis or occult pancreatic neoplasm. Consider EUS for further evaluation.  Assessment: Discussed with Dr. Brice Campi  Plan: EUS is reasonable.  Obtain CA19-9 and CBC/CMP/INR for her.  EUS can be scheduled as I have availability, if her CA19-9 returns elevated, let me know and we'll adjust the date accordingly.   Consider Fecal Calprotectin to evaluate for EPI or chronic pancreatitis issues.  Thanks.    Thank you for the courtesy of this consult. Please call me with any questions or concerns.   Lasonya Hubner, FNP-C [facility] 05/19/2024, 7:02 AM  Cc: Olan Bering, MD

## 2024-06-17 ENCOUNTER — Ambulatory Visit: Admitting: Gastroenterology

## 2024-07-05 ENCOUNTER — Encounter: Payer: Self-pay | Admitting: Gastroenterology

## 2024-08-15 DIAGNOSIS — W19XXXA Unspecified fall, initial encounter: Secondary | ICD-10-CM | POA: Diagnosis not present

## 2024-08-15 DIAGNOSIS — R339 Retention of urine, unspecified: Secondary | ICD-10-CM | POA: Diagnosis not present

## 2024-08-15 DIAGNOSIS — Z792 Long term (current) use of antibiotics: Secondary | ICD-10-CM | POA: Diagnosis not present

## 2024-08-15 DIAGNOSIS — R338 Other retention of urine: Secondary | ICD-10-CM | POA: Diagnosis not present

## 2024-08-15 DIAGNOSIS — R55 Syncope and collapse: Secondary | ICD-10-CM | POA: Diagnosis not present

## 2024-08-15 DIAGNOSIS — E860 Dehydration: Secondary | ICD-10-CM | POA: Diagnosis not present

## 2024-08-15 DIAGNOSIS — R1084 Generalized abdominal pain: Secondary | ICD-10-CM | POA: Diagnosis not present

## 2024-08-15 DIAGNOSIS — Z79899 Other long term (current) drug therapy: Secondary | ICD-10-CM | POA: Diagnosis not present

## 2024-08-15 DIAGNOSIS — R197 Diarrhea, unspecified: Secondary | ICD-10-CM | POA: Diagnosis not present

## 2024-08-15 DIAGNOSIS — N1330 Unspecified hydronephrosis: Secondary | ICD-10-CM | POA: Diagnosis not present

## 2024-08-15 DIAGNOSIS — A419 Sepsis, unspecified organism: Secondary | ICD-10-CM | POA: Diagnosis not present

## 2024-08-15 DIAGNOSIS — R531 Weakness: Secondary | ICD-10-CM | POA: Diagnosis not present

## 2024-08-17 DIAGNOSIS — Z79899 Other long term (current) drug therapy: Secondary | ICD-10-CM | POA: Diagnosis not present

## 2024-08-17 DIAGNOSIS — R339 Retention of urine, unspecified: Secondary | ICD-10-CM | POA: Diagnosis not present

## 2024-08-17 DIAGNOSIS — N952 Postmenopausal atrophic vaginitis: Secondary | ICD-10-CM | POA: Diagnosis not present

## 2024-08-26 ENCOUNTER — Ambulatory Visit: Admitting: Gastroenterology

## 2024-08-26 NOTE — Progress Notes (Deleted)
 Chief Complaint:Dilated pancreatic duct  Primary GI Doctor:***  HPI:  Patient is a  73  year old female patient with past medical history of hypertension*****who was referred to me by Jefferey Fitch, MD on 04/27/24 for a evaluation of Dilated pancreatic duct  .    Interval History  Patient admits/denies GERD Patient admits/denies dysphagia Patient admits/denies nausea, vomiting, or weight loss  Patient admits/denies altered bowel habits Patient admits/denies abdominal pain Patient admits/denies rectal bleeding   Denies/Admits alcohol  Denies/Admits smoking Denies/Admits NSAID use. Denies/Admits they are on blood thinners. Patient taking baby ASA 81mg  po daily  Patients last colonoscopy Patients last EGD  Surgical history: cholecystectomy 03/2010  Patient's family history includes  Wt Readings from Last 3 Encounters:  06/01/22 115 lb (52.2 kg)      Past Medical History:  Diagnosis Date   Hypertension     No past surgical history on file.  Current Outpatient Medications  Medication Sig Dispense Refill   ALPRAZolam (XANAX) 1 MG tablet Take 0.5-3 mg by mouth See admin instructions. Take 1-3 mg by mouth at bedtime and 0.5-1 mg during the daytime as needed for anxiety     amLODipine (NORVASC) 5 MG tablet Take 5 mg by mouth daily.     amoxicillin -clavulanate (AUGMENTIN ) 875-125 MG tablet Take 1 tablet by mouth every 12 (twelve) hours. 14 tablet 0   aspirin EC 81 MG tablet Take 81 mg by mouth daily. Swallow whole.     atenolol (TENORMIN) 50 MG tablet Take 50 mg by mouth daily.     colestipol (COLESTID) 1 g tablet Take 1 g by mouth daily before breakfast.     gabapentin (NEURONTIN) 300 MG capsule Take 300 mg by mouth 3 (three) times daily as needed (for neuropathy).     latanoprost (XALATAN) 0.005 % ophthalmic solution Place 1 drop into both eyes at bedtime.     methimazole (TAPAZOLE) 5 MG tablet Take 5 mg by mouth in the morning.     ondansetron  (ZOFRAN ) 4 MG tablet  Take 1 tablet (4 mg total) by mouth every 6 (six) hours. 12 tablet 0   oxyCODONE  (ROXICODONE ) 5 MG immediate release tablet Take 1 tablet (5 mg total) by mouth every 6 (six) hours as needed for severe pain. 15 tablet 0   No current facility-administered medications for this visit.    Allergies as of 08/26/2024 - Review Complete 06/01/2022  Allergen Reaction Noted   Codeine Nausea And Vomiting 06/01/2022    No family history on file.  Review of Systems:    Constitutional: No weight loss, fever, chills, weakness or fatigue HEENT: Eyes: No change in vision               Ears, Nose, Throat:  No change in hearing or congestion Skin: No rash or itching Cardiovascular: No chest pain, chest pressure or palpitations   Respiratory: No SOB or cough Gastrointestinal: See HPI and otherwise negative Genitourinary: No dysuria or change in urinary frequency Neurological: No headache, dizziness or syncope Musculoskeletal: No new muscle or joint pain Hematologic: No bleeding or bruising Psychiatric: No history of depression or anxiety    Physical Exam:  Vital signs: There were no vitals taken for this visit.  Constitutional:   Pleasant *** female/female appears to be in NAD, Well developed, Well nourished, alert and cooperative Eyes:   PEERL, EOMI. No icterus. Conjunctiva pink. Neck:  Supple Throat: Oral cavity and pharynx without inflammation, swelling or lesion.  Respiratory: Respirations even and unlabored. Lungs clear  to auscultation bilaterally.   No wheezes, crackles, or rhonchi.  Cardiovascular: Normal S1, S2. Regular rate and rhythm. No peripheral edema, cyanosis or pallor.  Gastrointestinal:  Soft, nondistended, nontender. No rebound or guarding. Normal bowel sounds. No appreciable masses or hepatomegaly. Rectal:  Not performed.  Anoscopy: Msk:  Symmetrical without gross deformities. Without edema, no deformity or joint abnormality.  Neurologic:  Alert and  oriented x4;  grossly normal  neurologically.  Skin:   Dry and intact without significant lesions or rashes.  RELEVANT LABS AND IMAGING: 10/09/23 labs show: BUN 24, Creat 1.21, Calcium 11.4, total bili 0.4, alk phosp 74, ALT 17, AST 20, Hgb 15, WBC 10.5, PLT 356 10/23/23 labs show: AST 22, ALT 14, Alk phosp 59, Total bili 0.4, WBC 8.9, Hgb 14.1, PLT 279, lipase 25, amylase 51  Imaging: 10/24 MRI abd: shows pancreatic ductal dilatation with abrupt stricture in the pancreatic neck. No definite pancreatic mass identified at this site. Differential diagnosis includes benign stricture related to chronic pancreatitis or occult pancreatic neoplasm. Consider EUS for further evaluation.  Assessment: 1. ***  Plan: - discuss case with our pancreatic specialist Dr. Wilhelmenia   Thank you for the courtesy of this consult. Please call me with any questions or concerns.   [name], FNP-C [facility] 08/26/2024, 6:46 AM  Cc: Jefferey Fitch, MD

## 2024-09-21 DIAGNOSIS — E0590 Thyrotoxicosis, unspecified without thyrotoxic crisis or storm: Secondary | ICD-10-CM | POA: Diagnosis not present

## 2024-09-21 DIAGNOSIS — R634 Abnormal weight loss: Secondary | ICD-10-CM | POA: Diagnosis not present

## 2024-09-21 DIAGNOSIS — N1330 Unspecified hydronephrosis: Secondary | ICD-10-CM | POA: Diagnosis not present

## 2024-09-21 DIAGNOSIS — I10 Essential (primary) hypertension: Secondary | ICD-10-CM | POA: Diagnosis not present

## 2024-09-21 DIAGNOSIS — G4700 Insomnia, unspecified: Secondary | ICD-10-CM | POA: Diagnosis not present

## 2024-09-21 DIAGNOSIS — Z79899 Other long term (current) drug therapy: Secondary | ICD-10-CM | POA: Diagnosis not present

## 2024-09-24 DIAGNOSIS — N281 Cyst of kidney, acquired: Secondary | ICD-10-CM | POA: Diagnosis not present

## 2024-09-24 DIAGNOSIS — N1330 Unspecified hydronephrosis: Secondary | ICD-10-CM | POA: Diagnosis not present

## 2024-09-27 DIAGNOSIS — N1330 Unspecified hydronephrosis: Secondary | ICD-10-CM | POA: Diagnosis not present

## 2024-09-27 DIAGNOSIS — N281 Cyst of kidney, acquired: Secondary | ICD-10-CM | POA: Diagnosis not present

## 2024-09-28 ENCOUNTER — Telehealth: Payer: Self-pay | Admitting: Gastroenterology

## 2024-09-28 ENCOUNTER — Encounter: Payer: Self-pay | Admitting: Gastroenterology

## 2024-09-28 NOTE — Telephone Encounter (Signed)
 Error

## 2024-11-18 ENCOUNTER — Ambulatory Visit: Admitting: Gastroenterology

## 2024-11-18 NOTE — Progress Notes (Deleted)
 SABRA
